# Patient Record
Sex: Male | Born: 1965
Health system: Southern US, Community
[De-identification: ages and names within clinical notes are randomized; demographics above are authoritative.]

## PROBLEM LIST (undated history)

## (undated) DIAGNOSIS — I10 Essential (primary) hypertension: Secondary | ICD-10-CM

## (undated) DIAGNOSIS — E785 Hyperlipidemia, unspecified: Secondary | ICD-10-CM

## (undated) DIAGNOSIS — Z9109 Other allergy status, other than to drugs and biological substances: Secondary | ICD-10-CM

## (undated) DIAGNOSIS — E079 Disorder of thyroid, unspecified: Secondary | ICD-10-CM

## (undated) HISTORY — DX: Hyperlipidemia, unspecified: E78.5

## (undated) HISTORY — PX: POLYPECTOMY: SHX149

## (undated) HISTORY — DX: Essential (primary) hypertension: I10

## (undated) HISTORY — PX: TONSILLECTOMY: SUR1361

## (undated) HISTORY — DX: Other allergy status, other than to drugs and biological substances: Z91.09

## (undated) HISTORY — DX: Disorder of thyroid, unspecified: E07.9

## (undated) HISTORY — PX: NOSE SURGERY: SHX723

---

## 2004-10-14 ENCOUNTER — Ambulatory Visit: Payer: Self-pay | Admitting: Internal Medicine

## 2006-09-08 ENCOUNTER — Encounter: Payer: Self-pay | Admitting: Internal Medicine

## 2006-11-01 DIAGNOSIS — E785 Hyperlipidemia, unspecified: Secondary | ICD-10-CM | POA: Insufficient documentation

## 2006-11-01 DIAGNOSIS — I10 Essential (primary) hypertension: Secondary | ICD-10-CM | POA: Insufficient documentation

## 2006-11-03 ENCOUNTER — Ambulatory Visit: Payer: Self-pay | Admitting: Internal Medicine

## 2010-03-30 ENCOUNTER — Encounter: Payer: Self-pay | Admitting: Cardiovascular Disease

## 2010-05-03 ENCOUNTER — Encounter: Payer: Self-pay | Admitting: Cardiovascular Disease

## 2010-05-10 DIAGNOSIS — R079 Chest pain, unspecified: Secondary | ICD-10-CM | POA: Insufficient documentation

## 2010-05-11 ENCOUNTER — Encounter: Payer: Self-pay | Admitting: Cardiovascular Disease

## 2010-05-11 ENCOUNTER — Ambulatory Visit: Payer: Self-pay | Admitting: Cardiovascular Disease

## 2010-05-19 ENCOUNTER — Telehealth (INDEPENDENT_AMBULATORY_CARE_PROVIDER_SITE_OTHER): Payer: Self-pay

## 2010-05-20 ENCOUNTER — Encounter (HOSPITAL_COMMUNITY)
Admission: RE | Admit: 2010-05-20 | Discharge: 2010-07-13 | Payer: Self-pay | Source: Home / Self Care | Attending: Cardiovascular Disease | Admitting: Cardiovascular Disease

## 2010-05-20 ENCOUNTER — Encounter: Payer: Self-pay | Admitting: Internal Medicine

## 2010-05-20 ENCOUNTER — Encounter: Payer: Self-pay | Admitting: Cardiovascular Disease

## 2010-05-20 ENCOUNTER — Ambulatory Visit: Payer: Self-pay

## 2010-05-20 ENCOUNTER — Ambulatory Visit (HOSPITAL_COMMUNITY)
Admission: RE | Admit: 2010-05-20 | Discharge: 2010-05-20 | Payer: Self-pay | Source: Home / Self Care | Attending: Cardiovascular Disease | Admitting: Cardiovascular Disease

## 2010-06-17 ENCOUNTER — Ambulatory Visit: Admit: 2010-06-17 | Payer: Self-pay | Admitting: Cardiovascular Disease

## 2010-07-13 NOTE — Assessment & Plan Note (Signed)
Summary: NP6/CHEST PAIN ON ELIPTICAL./LG   Visit Type:  new pt visit Primary Provider:  Ravisanker Avva  CC:  chest pain, pressure, and discomfort after exercising....pt c/o lethargy for the last 1 1/2 wks.  History of Present Illness: 45 yo male with history of HTN, hyperlipidemia referred today for evaluation of chest pain. He is an active individual with no prior cardiac disease. He describes  sharp  left sided chest pain with radiation into his left arm about 20 minutes after exercise. No pain with exercise. NO associated SOB, diaphoresis or nausea. First episode was two weeks ago with recurrence one week ago. He has also noticed this several times while on difficult hikes. Following the last episode of chest pain he felt fatigued and has felt that way for the last week. He has a high stress job and describes some dull left arm pressure and chest pressure today.   Current Medications (verified): 1)  Tenormin 25 Mg Tabs (Atenolol) .Marland Kitchen.. 1 Tab Once Daily Brand Name Medically Necessary 2)  Lipitor 20 Mg Tabs (Atorvastatin Calcium) .Marland Kitchen.. 1 Tab Once Daily 3)  Aspirin 81 Mg Tbec (Aspirin) .... Take One Tablet By Mouth Daily 4)  Vitamin B-12 100 Mcg Tabs (Cyanocobalamin) .Marland Kitchen.. 1 Tab Once Daily 5)  Multivitamins   Tabs (Multiple Vitamin) .Marland Kitchen.. 1 Tab Once Daily 6)  Alavert 10 Mg Tabs (Loratadine) .Marland Kitchen.. 1 Tab As Needed  Allergies (verified): 1)  ! Sulfa  Past History:  Past Medical History: HYPERTENSION (ICD-401.9) HYPERLIPIDEMIA (ICD-272.4)  Past Surgical History: Reviewed history from 05/10/2010 and no changes required. Appendectomy Tonsillectomy Nose surgeries 1983, 91, 52  Family History: Father: alive at age 62, BPH, DJD, Hyperlipidemia Mother: alive at age 31. HTN, TIA Siblings: Younger Brother .Marland KitchenHyperlipidemia, HTN  Social History: Married, no kids.  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  Alcohol Use - yes.Marland Kitchenoccasional. No illicit drug use.  Review of Systems       The  patient complains of chest pain.  The patient denies fatigue, malaise, fever, weight gain/loss, vision loss, decreased hearing, hoarseness, palpitations, shortness of breath, prolonged cough, wheezing, sleep apnea, coughing up blood, abdominal pain, blood in stool, nausea, vomiting, diarrhea, heartburn, incontinence, blood in urine, muscle weakness, joint pain, leg swelling, rash, skin lesions, headache, fainting, dizziness, depression, anxiety, enlarged lymph nodes, easy bruising or bleeding, and environmental allergies.    Vital Signs:  Patient profile:   45 year old male Height:      70 inches Weight:      161.50 pounds BMI:     23.26 Pulse rate:   51 / minute Pulse rhythm:   irregular BP sitting:   126 / 90  (left arm) Cuff size:   regular  Vitals Entered By: Danielle Rankin, CMA (May 11, 2010 11:03 AM)  Physical Exam  General:  General: Well developed, well nourished, NAD HEENT: OP clear, mucus membranes moist SKIN: warm, dry Neuro: No focal deficits Musculoskeletal: Muscle strength 5/5 all ext Psychiatric: Mood and affect normal Neck: No JVD, no carotid bruits, no thyromegaly, no lymphadenopathy. Lungs:Clear bilaterally, no wheezes, rhonci, crackles CV: RRR no murmurs, gallops rubs Abdomen: soft, NT, ND, BS present Extremities: No edema, pulses 1+ bilateral DP/PT.     EKG  Procedure date:  05/11/2010  Findings:      Sinus bradycardia. Diffuse ST elevation in precordial leads and inferior leads c/w repolarizatoin abnormality.   Impression & Recommendations:  Problem # 1:  CHEST PAIN (ICD-786.50) He has several risk factors for  CAD including HTN and hyperlipidemia. He has had recent chest and left arm pain after exercise. Will arrange exercise myoview to exclude ischemia and echocardiogram to assess LVEF and structural heart disease.   His updated medication list for this problem includes:    Tenormin 25 Mg Tabs (Atenolol) .Marland Kitchen... 1 tab once daily brand name medically  necessary    Aspirin 81 Mg Tbec (Aspirin) .Marland Kitchen... Take one tablet by mouth daily  Orders: EKG w/ Interpretation (93000) Nuclear Stress Test (Nuc Stress Test) Echocardiogram (Echo)  Patient Instructions: 1)  Your physician recommends that you schedule a follow-up appointment in: 3-4 weeks 2)  Your physician recommends that you continue on your current medications as directed. Please refer to the Current Medication list given to you today. 3)  Your physician has requested that you have an echocardiogram.  Echocardiography is a painless test that uses sound waves to create images of your heart. It provides your doctor with information about the size and shape of your heart and how well your heart's chambers and valves are working.  This procedure takes approximately one hour. There are no restrictions for this procedure. 4)  Your physician has requested that you have an exercise stress myoview.  For further information please visit [email or web address]  Please follow instruction sheet, as given.

## 2010-07-13 NOTE — Progress Notes (Signed)
Summary: Nuc. Pre-Procedure  Phone Note Outgoing Call Call back at [facility] Phone [phone]   Call placed by: Irean Hong, RN,  May 19, 2010 2:26 PM Summary of Call: Left message with information on Myoview Information Sheet (see scanned document for details).      Nuclear Med Background Indications for Stress Test: Evaluation for Ischemia     Symptoms: Chest Pain with Exertion, Chest Pressure with Exertion, Fatigue, Fatigue with Exertion  Symptoms Comments: Radiates to left arm.   Nuclear Pre-Procedure Cardiac Risk Factors: Hypertension, Lipids Height (in): 70

## 2010-07-13 NOTE — Progress Notes (Signed)
Summary: [facility]  [facility]   Imported By: Earl Many 05/14/2010 16:31:42  _____________________________________________________________________  External Attachment:    Type:   Image     Comment:   External Document

## 2010-07-15 NOTE — Assessment & Plan Note (Signed)
Summary: Cardiology Nuclear Testing  Nuclear Med Background Indications for Stress Test: Evaluation for Ischemia    History Comments: No documented CAD  Symptoms: Chest Pain, Chest Pain with Exertion, Chest Pressure, Chest Pressure with Exertion, Fatigue, Fatigue with Exertion  Symptoms Comments:  CP>(L) arm.   Nuclear Pre-Procedure Cardiac Risk Factors: Hypertension, Lipids Caffeine/Decaff Intake: None NPO After: 8:00 PM Lungs: Clear IV 0.9% NS with Angio Cath: 22g     IV Site: R Antecubital IV Started by: Irean Hong, RN Chest Size (in) 40     Height (in): 70 Weight (lb): 154 BMI: 22.18 Tech Comments: Tenormin held x 36 hours.  Nuclear Med Study 1 or 2 day study:  1 day     Stress Test Type:  Stress Reading MD:  Arvilla Meres, MD     Referring MD:  Verne Carrow, MD Resting Radionuclide:  Technetium 48m Tetrofosmin     Resting Radionuclide Dose:  10.7 mCi  Stress Radionuclide:  Technetium 109m Tetrofosmin     Stress Radionuclide Dose:  33 mCi   Stress Protocol Exercise Time (min):  14:00 min     Max HR:  157 bpm     Predicted Max HR:  174 bpm  Max Systolic BP: 169 mm Hg     Percent Max HR:  90.23 %     METS: 17.2 Rate Pressure Product:  40347    Stress Test Technologist:  [employer], CMA-N     Nuclear Technologist:  Domenic Polite, CNMT  Rest Procedure  Myocardial perfusion imaging was performed at rest 45 minutes following the intravenous administration of Technetium 70m Tetrofosmin.  Stress Procedure  The patient exercised for fourteen minutes.  The patient stopped due to fatigue and denied any chest pain.  There were nonspecific ST-T wave changes, more pronounced in recovery.  There were occasional PVC's and PAC's.  Technetium 81m Tetrofosmin was injected at peak exercise and myocardial perfusion imaging was performed after a brief delay.  QPS Raw Data Images:  Normal; no motion artifact; normal heart/lung ratio. Stress Images:  Normal homogeneous  uptake in all areas of the myocardium. Rest Images:  Normal homogeneous uptake in all areas of the myocardium. Subtraction (SDS):  Normal Transient Ischemic Dilatation:  .98  (Normal <1.22)  Lung/Heart Ratio:  .36  (Normal <0.45)  Quantitative Gated Spect Images QGS EDV:  90 ml QGS ESV:  39 ml QGS EF:  57 % QGS cine images:  Normal  Findings Normal nuclear study      Overall Impression  Exercise Capacity: Excellent exercise capacity. BP Response: Normal blood pressure response. Clinical Symptoms: There is dyspnea. ECG Impression: No significant ST segment change suggestive of ischemia. Overall Impression: Normal stress nuclear study.  Appended Document: Cardiology Nuclear Testing Normal stress. Can we call him? thanks, cdm

## 2011-10-27 ENCOUNTER — Other Ambulatory Visit: Payer: Self-pay | Admitting: Internal Medicine

## 2011-10-27 ENCOUNTER — Ambulatory Visit
Admission: RE | Admit: 2011-10-27 | Discharge: 2011-10-27 | Disposition: A | Discharge status: Home / Self Care | Payer: BC Managed Care – PPO | Source: Ambulatory Visit | Attending: Internal Medicine | Admitting: Internal Medicine

## 2011-10-27 DIAGNOSIS — E049 Nontoxic goiter, unspecified: Secondary | ICD-10-CM

## 2011-11-02 ENCOUNTER — Other Ambulatory Visit: Payer: Self-pay | Admitting: Internal Medicine

## 2011-11-02 DIAGNOSIS — E041 Nontoxic single thyroid nodule: Secondary | ICD-10-CM

## 2011-11-08 ENCOUNTER — Ambulatory Visit
Admission: RE | Admit: 2011-11-08 | Discharge: 2011-11-08 | Disposition: A | Discharge status: Home / Self Care | Payer: BC Managed Care – PPO | Source: Ambulatory Visit | Attending: Internal Medicine | Admitting: Internal Medicine

## 2011-11-08 ENCOUNTER — Other Ambulatory Visit (HOSPITAL_COMMUNITY)
Admission: RE | Admit: 2011-11-08 | Discharge: 2011-11-08 | Disposition: A | Discharge status: Home / Self Care | Payer: BC Managed Care – PPO | Source: Ambulatory Visit | Attending: Diagnostic Radiology | Admitting: Diagnostic Radiology

## 2011-11-08 DIAGNOSIS — E041 Nontoxic single thyroid nodule: Secondary | ICD-10-CM | POA: Insufficient documentation

## 2012-05-29 ENCOUNTER — Other Ambulatory Visit: Payer: Self-pay | Admitting: Internal Medicine

## 2012-05-29 DIAGNOSIS — E041 Nontoxic single thyroid nodule: Secondary | ICD-10-CM

## 2012-05-31 ENCOUNTER — Ambulatory Visit
Admission: RE | Admit: 2012-05-31 | Discharge: 2012-05-31 | Disposition: A | Discharge status: Home / Self Care | Payer: BC Managed Care – PPO | Source: Ambulatory Visit | Attending: Internal Medicine | Admitting: Internal Medicine

## 2012-05-31 DIAGNOSIS — E041 Nontoxic single thyroid nodule: Secondary | ICD-10-CM

## 2012-11-01 ENCOUNTER — Other Ambulatory Visit: Payer: Self-pay | Admitting: Internal Medicine

## 2012-11-01 DIAGNOSIS — E049 Nontoxic goiter, unspecified: Secondary | ICD-10-CM

## 2013-04-16 ENCOUNTER — Ambulatory Visit
Admission: RE | Admit: 2013-04-16 | Discharge: 2013-04-16 | Disposition: A | Discharge status: Home / Self Care | Payer: BC Managed Care – PPO | Source: Ambulatory Visit | Attending: Internal Medicine | Admitting: Internal Medicine

## 2013-04-16 DIAGNOSIS — E049 Nontoxic goiter, unspecified: Secondary | ICD-10-CM

## 2013-04-19 ENCOUNTER — Other Ambulatory Visit: Payer: BC Managed Care – PPO

## 2013-11-08 ENCOUNTER — Encounter: Payer: Self-pay | Admitting: Internal Medicine

## 2013-12-06 ENCOUNTER — Ambulatory Visit (AMBULATORY_SURGERY_CENTER): Payer: Self-pay

## 2013-12-06 ENCOUNTER — Encounter: Payer: Self-pay | Admitting: Internal Medicine

## 2013-12-06 VITALS — Ht 69.0 in | Wt 154.0 lb

## 2013-12-06 DIAGNOSIS — Z1211 Encounter for screening for malignant neoplasm of colon: Secondary | ICD-10-CM

## 2013-12-06 MED ORDER — MOVIPREP 100 G PO SOLR: 1.0000 | Freq: Once | ORAL | Status: DC

## 2013-12-06 NOTE — Progress Notes (Signed)
No allergies to eggs or soy No home oxygen No diet/weight loss meds No past problems with anesthesia  Has email  Emmi instructions given for colonoscopy 

## 2013-12-19 ENCOUNTER — Encounter: Payer: BC Managed Care – PPO | Admitting: Internal Medicine

## 2013-12-24 IMAGING — US US THYROID BIOPSY
1 series · 10 of 10 positions shown · non-contrast
Comparison: 10/27/2011

CLINICAL DATA: Dominant right thyroid nodule.

ULTRASOUND GUIDED NEEDLE ASPIRATE BIOPSY OF THE THYROID GLAND

[Series 1: us thyroid biopsy · 0.07mm/px · 10 acquisitions, 10 frames shown]
[im 1/10]
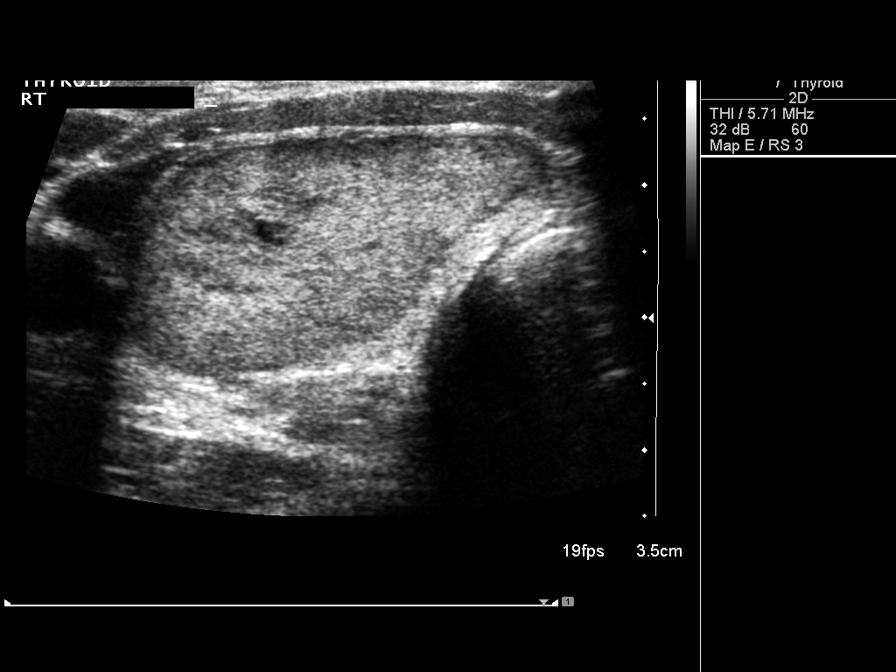
[im 2/10]
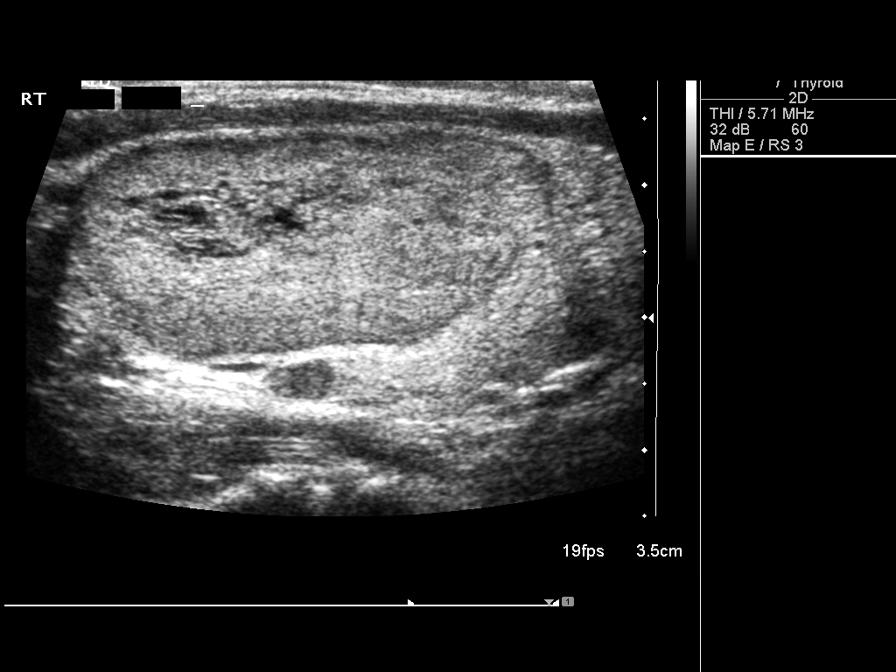
[im 3/10]
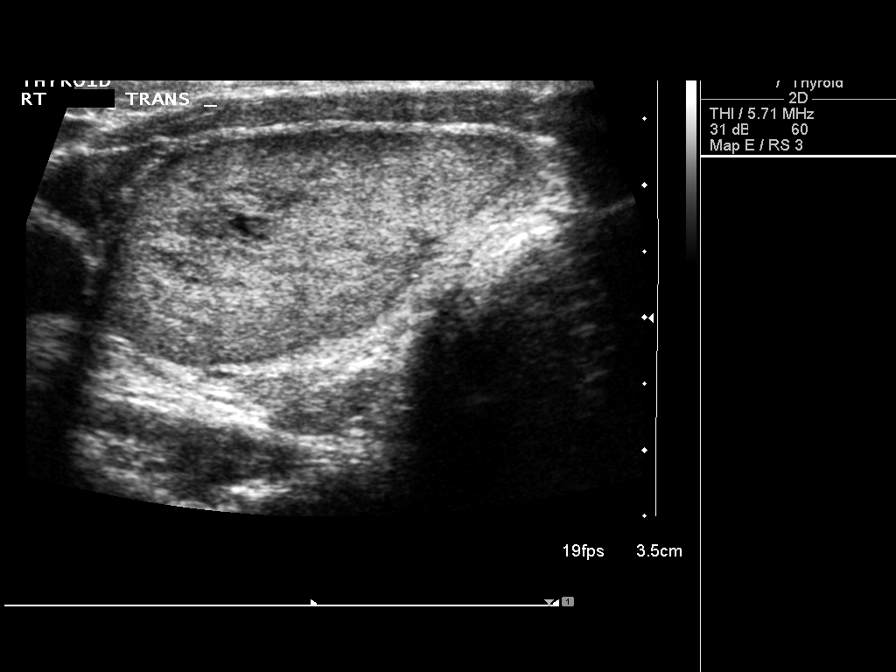
[im 4/10]
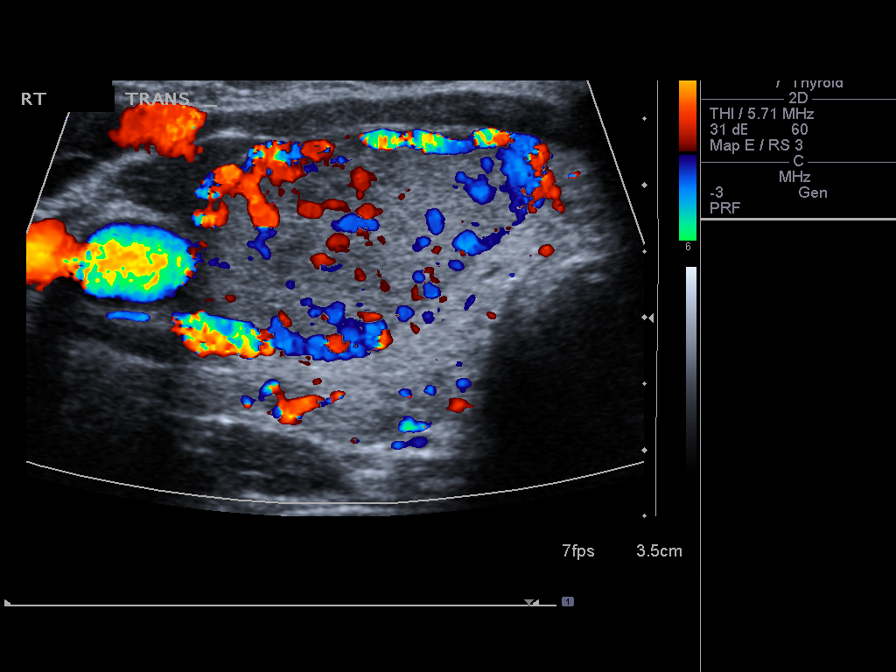
[im 5/10]
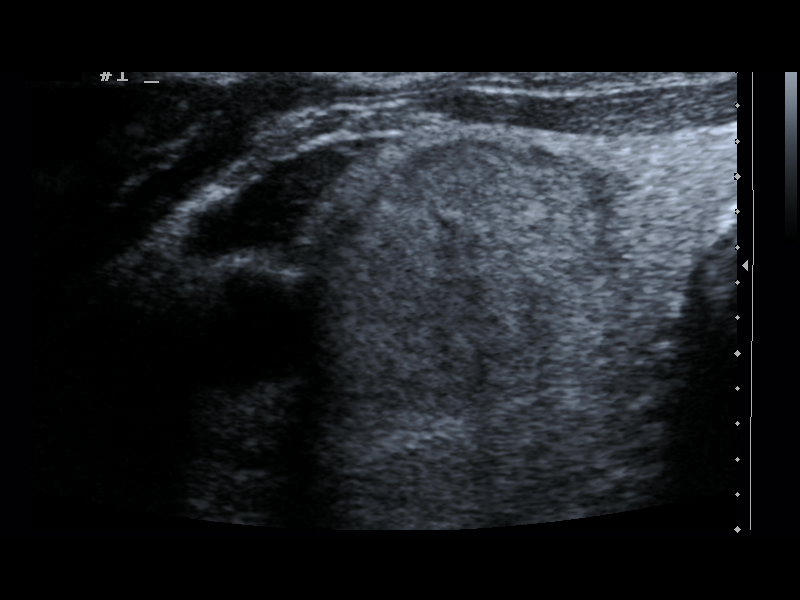
[im 6/10]
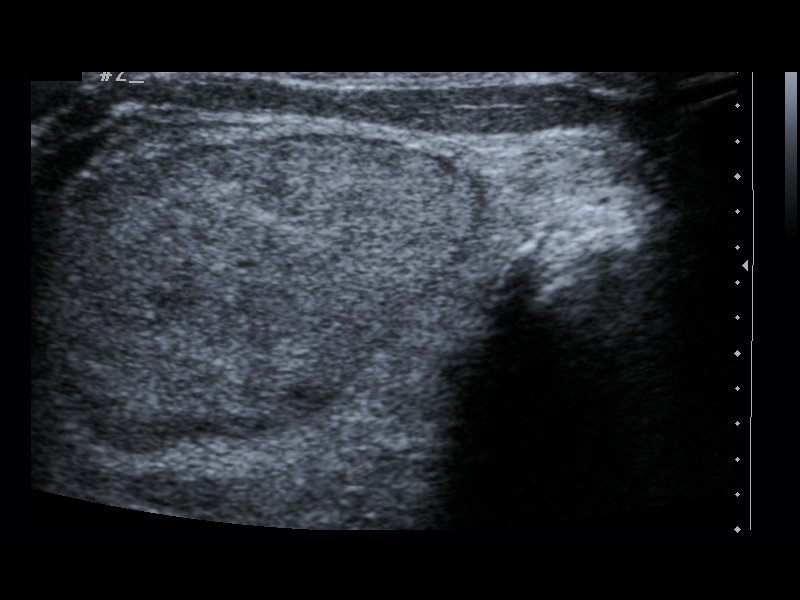
[im 7/10]
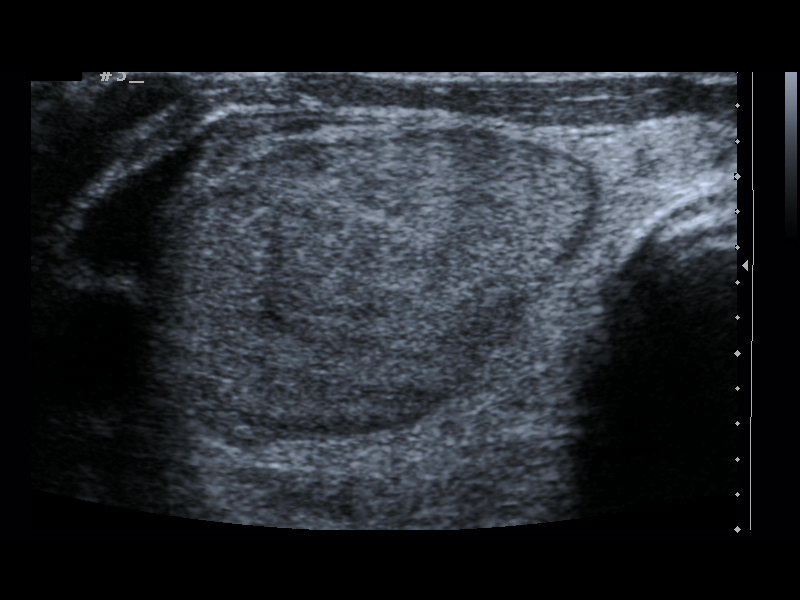
[im 8/10]
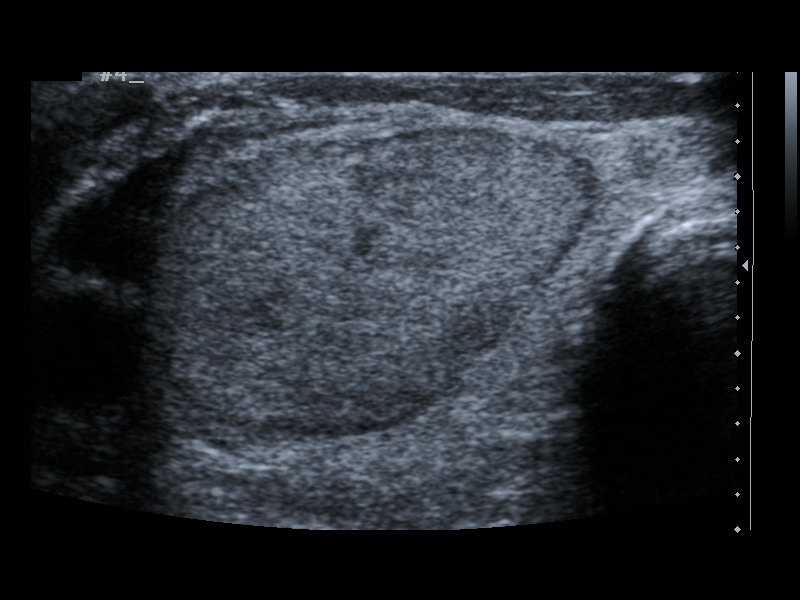
[im 9/10]
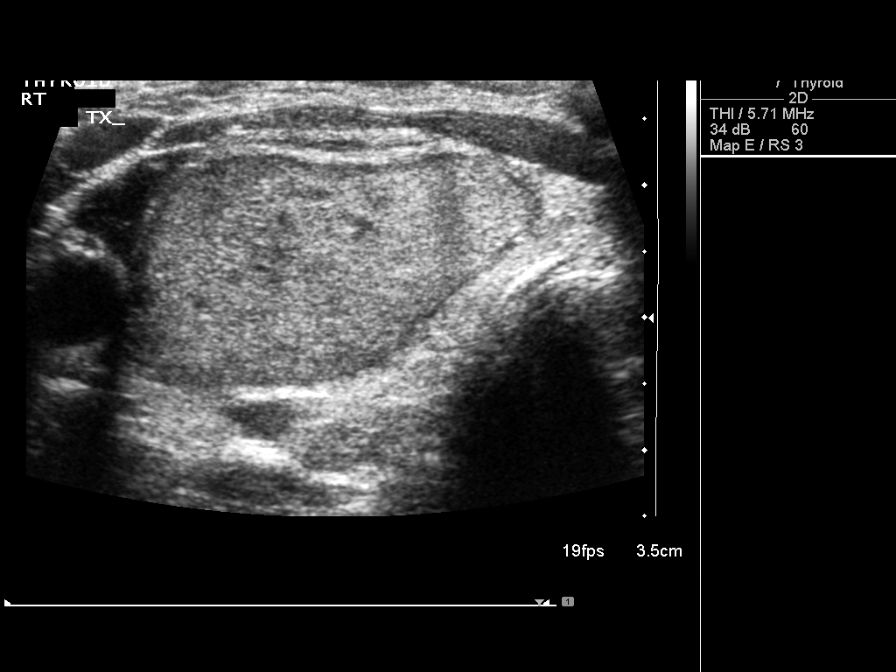
[im 10/10]
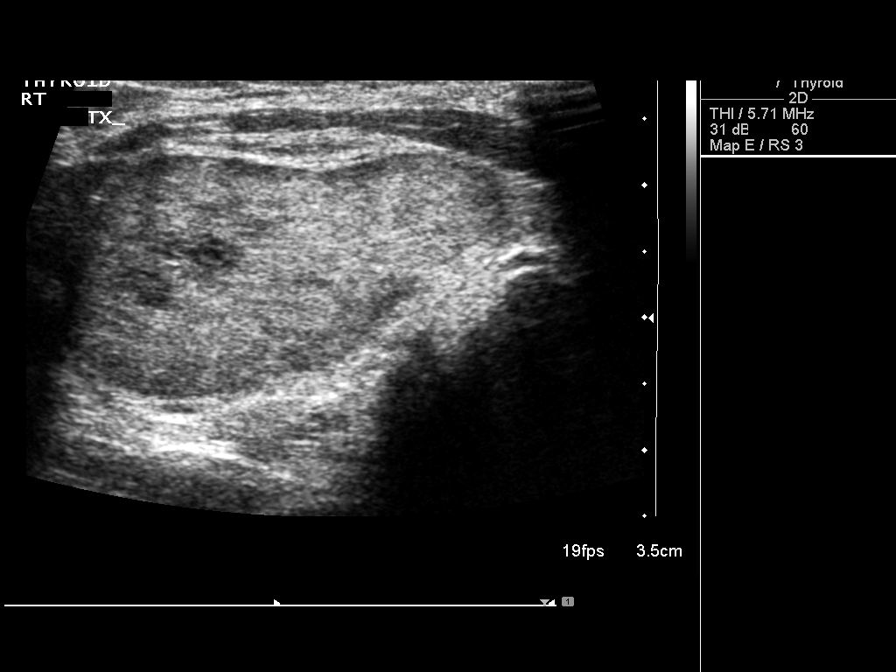

[10 of 10 positions shown; findings below may reference images not displayed]

Thyroid biopsy was thoroughly discussed with the patient and
questions were answered.  The benefits, risks, alternatives, and
complications were also discussed.  The patient understands and
wishes to proceed with the procedure.  Written consent was
obtained.

Ultrasound was performed to localize and mark an adequate site for
the biopsy.  The patient was then prepped and draped in a normal
sterile fashion.  Local anesthesia was provided with 1% lidocaine.
Using direct ultrasound guidance, 4 passes were made using 25 gauge
needles into the nodule within the right lobe of the thyroid.
Ultrasound was used to confirm needle placements on all occasions.
Specimens were sent to Pathology for analysis.

Complications:  None
FINDINGS: Dominant right thyroid nodule with mild heterogeneity.
IMPRESSION: Ultrasound guided needle aspirate biopsy performed of the right
thyroid nodule.

## 2013-12-31 ENCOUNTER — Encounter: Payer: Self-pay | Admitting: Internal Medicine

## 2013-12-31 ENCOUNTER — Ambulatory Visit (AMBULATORY_SURGERY_CENTER): Payer: BC Managed Care – PPO | Admitting: Internal Medicine

## 2013-12-31 VITALS — BP 117/76 | HR 50 | Temp 96.3°F | Resp 16 | Ht 69.0 in | Wt 154.0 lb

## 2013-12-31 DIAGNOSIS — Z1211 Encounter for screening for malignant neoplasm of colon: Secondary | ICD-10-CM

## 2013-12-31 DIAGNOSIS — D126 Benign neoplasm of colon, unspecified: Secondary | ICD-10-CM

## 2013-12-31 HISTORY — PX: COLONOSCOPY: SHX174

## 2013-12-31 MED ORDER — SODIUM CHLORIDE 0.9 % IV SOLN: 500.0000 mL | INTRAVENOUS | Status: DC

## 2013-12-31 NOTE — Patient Instructions (Signed)
YOU HAD AN ENDOSCOPIC PROCEDURE TODAY AT THE [facility]: Refer to the procedure report that was given to you for any specific questions about what was found during the examination.  If the procedure report does not answer your questions, please call your gastroenterologist to clarify.  If you requested that your care partner not be given the details of your procedure findings, then the procedure report has been included in a sealed envelope for you to review at your convenience later.  YOU SHOULD EXPECT: Some feelings of bloating in the abdomen. Passage of more gas than usual.  Walking can help get rid of the air that was put into your GI tract during the procedure and reduce the bloating. If you had a lower endoscopy (such as a colonoscopy or flexible sigmoidoscopy) you may notice spotting of blood in your stool or on the toilet paper. If you underwent a bowel prep for your procedure, then you may not have a normal bowel movement for a few days.  DIET: Your first meal following the procedure should be a light meal and then it is ok to progress to your normal diet.  A half-sandwich or bowl of soup is an example of a good first meal.  Heavy or fried foods are harder to digest and may make you feel nauseous or bloated.  Likewise meals heavy in dairy and vegetables can cause extra gas to form and this can also increase the bloating.  Drink plenty of fluids but you should avoid alcoholic beverages for 24 hours.  ACTIVITY: Your care partner should take you home directly after the procedure.  You should plan to take it easy, moving slowly for the rest of the day.  You can resume normal activity the day after the procedure however you should NOT DRIVE or use heavy machinery for 24 hours (because of the sedation medicines used during the test).    SYMPTOMS TO REPORT IMMEDIATELY: A gastroenterologist can be reached at any hour.  During normal business hours, 8:30 AM to 5:00 PM Monday through Friday,  call [phone].  After hours and on weekends, please call the GI answering service at [phone] who will take a message and have the physician on call contact you.   Following lower endoscopy (colonoscopy or flexible sigmoidoscopy):  Excessive amounts of blood in the stool  Significant tenderness or worsening of abdominal pains  Swelling of the abdomen that is new, acute  Fever of 100F or higher  FOLLOW UP: If any biopsies were taken you will be contacted by phone or by letter within the next 1-3 weeks.  Call your gastroenterologist if you have not heard about the biopsies in 3 weeks.  Our staff will call the home number listed on your records the next business day following your procedure to check on you and address any questions or concerns that you may have at that time regarding the information given to you following your procedure. This is a courtesy call and so if there is no answer at the home number and we have not heard from you through the emergency physician on call, we will assume that you have returned to your regular daily activities without incident.  SIGNATURES/CONFIDENTIALITY: You and/or your care partner have signed paperwork which will be entered into your electronic medical record.  These signatures attest to the fact that that the information above on your After Visit Summary has been reviewed and is understood.  Full responsibility of the confidentiality of this   discharge information lies with you and/or your care-partner.     Handouts were given to your care partner on polyps, diverticulosis, and a high fiber diet with liberal fluid intake. You may resume your current medications today. Await biopsy results. Please call if any questions or concerns.   

## 2013-12-31 NOTE — Progress Notes (Signed)
No complaints noted in the recovery room. Maw   

## 2013-12-31 NOTE — Op Note (Signed)
[place]  [address]. [place], [id number]   COLONOSCOPY PROCEDURE REPORT  PATIENT: Mark Sims, Mark Sims  MR#: [id number] BIRTHDATE: 1966-01-22 , 48  yrs. old GENDER: Male ENDOSCOPIST: Jerene Bears, MD REFERRED WU:[id number] Avva, M.D. PROCEDURE DATE:  12/31/2013 PROCEDURE:   Colonoscopy with snare polypectomy First Screening Colonoscopy - Avg.  risk and is 50 yrs.  old or older Yes.  Prior Negative Screening - Now for repeat screening. N/A  History of Adenoma - Now for follow-up colonoscopy & has been > or = to 3 yrs.  N/A  Polyps Removed Today? Yes. ASA CLASS:   Class II INDICATIONS:average risk screening and first colonoscopy. MEDICATIONS: MAC sedation, administered by CRNA, Propofol (Diprivan), and propofol (Diprivan) 250mg  IV  DESCRIPTION OF PROCEDURE:   After the risks benefits and alternatives of the procedure were thoroughly explained, informed consent was obtained.  A digital rectal exam revealed external hemorrhoids.   The LB [id number]  endoscope was introduced through the anus and advanced to the terminal ileum which was intubated for a short distance. No adverse events experienced. The quality of the prep was good, using MoviPrep  The instrument was then slowly withdrawn as the colon was fully examined.  COLON FINDINGS: The mucosa appeared normal in the terminal ileum. Two sessile polyps ranging between 3-82mm in size, one with mucous cap, were found in the transverse colon.  Polypectomy was performed using cold snare.  All resections were complete and all polyp tissue was completely retrieved.   There was mild diverticulosis noted in the sigmoid colon with associated muscular hypertrophy. Retroflexed views revealed small external hemorrhoids. The time to cecum=2 minutes 10 seconds.  Withdrawal time=10 minutes 51 seconds. The scope was withdrawn and the procedure completed. COMPLICATIONS: There were no complications.  ENDOSCOPIC IMPRESSION: 1.    Normal mucosa in the terminal ileum 2.   Two sessile polyps ranging between 3-10mm in size were found in the transverse colon; Polypectomy was performed using cold snare 3.   There was mild diverticulosis noted in the sigmoid colon  RECOMMENDATIONS: 1.  Await pathology results 2.  High fiber diet 3.  If the polyps removed today are proven to be adenomatous (pre-cancerous) polyps, you will need a repeat colonoscopy in 5 years.  Otherwise you should continue to follow colorectal cancer screening guidelines for "routine risk" patients with colonoscopy in 10 years.  You will receive a letter within 1-2 weeks with the results of your biopsy as well as final recommendations.  Please call my office if you have not received a letter after 3 weeks.   eSigned:  Jerene Bears, MD 12/31/2013 8:29 AM      cc: The Patient and Prince Solian, MD

## 2013-12-31 NOTE — Progress Notes (Signed)
Called to room to assist during endoscopic procedure.  Patient ID and intended procedure confirmed with present staff. Received instructions for my participation in the procedure from the performing physician.  

## 2013-12-31 NOTE — Progress Notes (Signed)
A/ox3 pleased with MAC, report to Annette RN 

## 2014-01-01 ENCOUNTER — Telehealth: Payer: Self-pay | Admitting: *Deleted

## 2014-01-01 NOTE — Telephone Encounter (Signed)
No answer, left message to call if questions or concerns. 

## 2014-01-13 ENCOUNTER — Encounter: Payer: Self-pay | Admitting: Internal Medicine

## 2014-04-01 ENCOUNTER — Emergency Department (HOSPITAL_COMMUNITY)
Admission: EM | Admit: 2014-04-01 | Discharge: 2014-04-01 | Disposition: A | Discharge status: Home / Self Care | Payer: BC Managed Care – PPO | Attending: Emergency Medicine | Admitting: Emergency Medicine

## 2014-04-01 ENCOUNTER — Encounter (HOSPITAL_COMMUNITY): Payer: Self-pay | Admitting: Emergency Medicine

## 2014-04-01 DIAGNOSIS — Z79899 Other long term (current) drug therapy: Secondary | ICD-10-CM | POA: Diagnosis not present

## 2014-04-01 DIAGNOSIS — I10 Essential (primary) hypertension: Secondary | ICD-10-CM | POA: Diagnosis not present

## 2014-04-01 DIAGNOSIS — R040 Epistaxis: Secondary | ICD-10-CM | POA: Diagnosis not present

## 2014-04-01 DIAGNOSIS — Z7982 Long term (current) use of aspirin: Secondary | ICD-10-CM | POA: Diagnosis not present

## 2014-04-01 DIAGNOSIS — Z9889 Other specified postprocedural states: Secondary | ICD-10-CM | POA: Diagnosis not present

## 2014-04-01 DIAGNOSIS — E785 Hyperlipidemia, unspecified: Secondary | ICD-10-CM | POA: Insufficient documentation

## 2014-04-01 MED ORDER — OXYMETAZOLINE HCL 0.05 % NA SOLN
1.0000 | Freq: Once | NASAL | Status: AC
  Administered 2014-04-01: 1 via NASAL
  Filled 2014-04-01: qty 15

## 2014-04-01 MED ORDER — PHENYLEPHRINE HCL 0.5 % NA SOLN
1.0000 [drp] | Freq: Once | NASAL | Status: DC
  Filled 2014-04-01: qty 15

## 2014-04-01 NOTE — ED Provider Notes (Signed)
CSN: [id number]     Arrival date & time 04/01/14  0347 History   First MD Initiated Contact with Patient 04/01/14 0801     Chief Complaint  Patient presents with  . Epistaxis     (Consider location/radiation/quality/duration/timing/severity/associated sxs/prior Treatment) HPI Comments: Patient is a 48 year old male past medical history significant for hypertension, hyperlipidemia, environmental allergies presenting to the emergency department for acute onset right-sided nose bleed that began approximately 15 minutes prior to arrival. Patient states he woke up, with his nose that started the nosebleed. He states one large clot came out of his nose. Bleeding has slowed with constant direct pressure. History of trauma to his nose. He is not on any blood thinners. Previous nasal surgeries for his septal deviation, 24 years ago.   Past Medical History  Diagnosis Date  . Hypertension   . Hyperlipemia   . Environmental allergies    Past Surgical History  Procedure Laterality Date  . Nose surgery      x3; last one 1989  . Tonsillectomy      age 56   Family History  Problem Relation Age of Onset  . Colon cancer Neg Hx   . Pancreatic cancer Neg Hx   . Rectal cancer Neg Hx   . Stomach cancer Neg Hx   . Heart disease Mother   . Transient ischemic attack Mother   . Kidney cancer Father   . Heart disease Father   . Heart disease Maternal Grandmother   . Heart disease Maternal Grandfather   . Heart disease Paternal Grandmother   . Heart disease Paternal Grandfather    History  Substance Use Topics  . Smoking status: Never Smoker   . Smokeless tobacco: Never Used  . Alcohol Use: 0.6 oz/week    1 Glasses of wine per week     Comment: and liquor    Review of Systems  HENT: Positive for congestion and nosebleeds.   All other systems reviewed and are negative.     Allergies  Sulfonamide derivatives  Home Medications   Prior to Admission medications   Medication Sig Start  Date End Date Taking? Authorizing Provider  aspirin 81 MG EC tablet Take 81 mg by mouth daily. Swallow whole.   Yes Historical Provider, MD  atenolol (TENORMIN) 25 MG tablet Take by mouth daily.   Yes Historical Provider, MD  atorvastatin (LIPITOR) 20 MG tablet Take 20 mg by mouth daily.   Yes Historical Provider, MD  B Complex-C (B-COMPLEX WITH VITAMIN C) tablet Take 1 tablet by mouth daily.   Yes Historical Provider, MD  levothyroxine (SYNTHROID, LEVOTHROID) 25 MCG tablet Take 25 mcg by mouth daily before breakfast.   Yes Historical Provider, MD  montelukast (SINGULAIR) 10 MG tablet Take 10 mg by mouth at bedtime.   Yes Historical Provider, MD  Multiple Vitamin (MULTIVITAMIN) tablet Take 1 tablet by mouth daily.   Yes Historical Provider, MD   BP 125/78  Pulse 45  Temp(Src) 97.4 F (36.3 C) (Oral)  Resp 16  Ht 5\' 9"  (1.753 m)  Wt 150 lb (68.04 kg)  BMI 22.14 kg/m2  SpO2 100% Physical Exam  Nursing note and vitals reviewed. Constitutional: He is oriented to person, place, and time. He appears well-developed and well-nourished. No distress.  HENT:  Head: Normocephalic and atraumatic.  Right Ear: External ear normal.  Left Ear: External ear normal.  Nose: No rhinorrhea, septal deviation or nasal septal hematoma.  No foreign bodies. Right sinus exhibits no maxillary sinus tenderness  and no frontal sinus tenderness. Left sinus exhibits no maxillary sinus tenderness and no frontal sinus tenderness.  Mouth/Throat: Oropharynx is clear and moist.  Dried blood in bilateral nares. Occasional drip of blood. No active nosebleed. No active posterior oropharynx bleeding. Small streak of dried blood in posterior pharynx.  Eyes: Conjunctivae are normal.  Neck: Normal range of motion. Neck supple.  Cardiovascular: Normal rate, regular rhythm and normal heart sounds.   Pulmonary/Chest: Effort normal and breath sounds normal.  Abdominal: Soft.  Musculoskeletal: Normal range of motion.  Neurological:  He is alert and oriented to person, place, and time.  Skin: Skin is warm and dry. He is not diaphoretic.  Psychiatric: He has a normal mood and affect.    ED Course  Procedures (including critical care time) Medications  oxymetazoline (AFRIN) 0.05 % nasal spray 1 spray (1 spray Each Nare Given 04/01/14 0858)    Labs Review Labs Reviewed - No data to display  Imaging Review No results found.   EKG Interpretation None      MDM   Final diagnoses:  Epistaxis    Filed Vitals:   04/01/14 1013  BP: 125/78  Pulse: 45  Temp:   Resp: 16   Afebrile, NAD, non-toxic appearing, AAOx4.   Patient presents to the emergency department after a nosebleed approximately 15 minutes prior to arrival. Patient is not actively bleeding in the emergency department at this time. No septal hematoma or deviation noted. Patient is not on any blood thinners. Likely anterior nosebleed. He is hemodynamically stable at discharge. His TPN [facility] for followup. Precautions discussed. Patient is agreeable to plan. Patient stable upon discharge. Patient d/w with Dr. Kathrynn Humble, agrees with plan.      Harlow Mares, PA-C 04/01/14 1506

## 2014-04-01 NOTE — Discharge Instructions (Signed)
Please follow up with your primary care physician in 1-2 days. If you do not have one please call the [facility] number listed above. Please follow up with Dr. Constance Holster to schedule a follow up appointment for any further ENT needs. Please use the Afrin spray as advised. Please try to keep your bedroom with moist air via a humidifier. You may also place vasoline in your nostrils at night time. Please continue to use your daily allergy medication. Please read all discharge instructions and return precautions.    Nosebleed Nosebleeds can be caused by many conditions, including trauma, infections, polyps, foreign bodies, dry mucous membranes or climate, medicines, and air conditioning. Most nosebleeds occur in the front of the nose. Because of this location, most nosebleeds can be controlled by pinching the nostrils gently and continuously for at least 10 to 20 minutes. The long, continuous pressure allows enough time for the blood to clot. If pressure is released during that 10 to 20 minute time period, the process may have to be started again. The nosebleed may stop by itself or quit with pressure, or it may need concentrated heating (cautery) or pressure from packing. HOME CARE INSTRUCTIONS   If your nose was packed, try to maintain the pack inside until your health care provider removes it. If a gauze pack was used and it starts to fall out, gently replace it or cut the end off. Do not cut if a balloon catheter was used to pack the nose. Otherwise, do not remove unless instructed.  Avoid blowing your nose for 12 hours after treatment. This could dislodge the pack or clot and start the bleeding again.  If the bleeding starts again, sit up and bend forward, gently pinching the front half of your nose continuously for 20 minutes.  If bleeding was caused by dry mucous membranes, use over-the-counter saline nasal spray or gel. This will keep the mucous membranes moist and allow them to  heal. If you must use a lubricant, choose the water-soluble variety. Use it only sparingly and not within several hours of lying down.  Do not use petroleum jelly or mineral oil, as these may drip into the lungs and cause serious problems.  Maintain humidity in your home by using less air conditioning or by using a humidifier.  Do not use aspirin or medicines which make bleeding more likely. Your health care provider can give you recommendations on this.  Resume normal activities as you are able, but try to avoid straining, lifting, or bending at the waist for several days.  If the nosebleeds become recurrent and the cause is unknown, your health care provider may suggest laboratory tests. SEEK MEDICAL CARE IF: You have a fever. SEEK IMMEDIATE MEDICAL CARE IF:   Bleeding recurs and cannot be controlled.  There is unusual bleeding from or bruising on other parts of the body.  Nosebleeds continue.  There is any worsening of the condition which originally brought you in.  You become light-headed, feel faint, become sweaty, or vomit blood. MAKE SURE YOU:   Understand these instructions.  Will watch your condition.  Will get help right away if you are not doing well or get worse. Document Released: 03/09/2005 Document Revised: 10/14/2013 Document Reviewed: 04/30/2009 [facility] Patient Information 2015 [place], [place]. This information is not intended to replace advice given to you by your health care provider. Make sure you discuss any questions you have with your health care provider.

## 2014-04-01 NOTE — ED Notes (Signed)
Per pt, states his nose has been bleeding for approximately 15 minutes. He woke up and blew his nose and it began bleeding. Pt also states a clot came out when he blew his nose.

## 2014-04-01 NOTE — ED Notes (Addendum)
Pt comfortable, nose has stopped bleeding at this point. Pt states he does feel stopped up still in the left nostril.

## 2014-04-01 NOTE — ED Notes (Signed)
PA at bedside.

## 2014-04-01 NOTE — ED Provider Notes (Signed)
Medical screening examination/treatment/procedure(s) were performed by non-physician practitioner and as supervising physician I was immediately available for consultation/collaboration.   EKG Interpretation None       Varney Biles, MD 04/01/14 1811

## 2014-12-05 ENCOUNTER — Ambulatory Visit (INDEPENDENT_AMBULATORY_CARE_PROVIDER_SITE_OTHER): Payer: BLUE CROSS/BLUE SHIELD | Admitting: Podiatry

## 2014-12-05 ENCOUNTER — Encounter: Payer: Self-pay | Admitting: Podiatry

## 2014-12-05 DIAGNOSIS — M79673 Pain in unspecified foot: Secondary | ICD-10-CM | POA: Diagnosis not present

## 2014-12-05 DIAGNOSIS — M722 Plantar fascial fibromatosis: Secondary | ICD-10-CM | POA: Diagnosis not present

## 2014-12-05 NOTE — Progress Notes (Signed)
   Subjective:    Patient ID: Mark Sims, male    DOB: Mar 20, 1966, 49 y.o.   MRN: [id number]  HPI PT STATED HAVE HISTORY OF PLANTAR FASCIITIS ON B/L FEET AND NEEDED A NEW PAIR ORTHOTICS.   Review of Systems  All other systems reviewed and are negative.      Objective:   Physical Exam        Assessment & Plan:

## 2014-12-08 NOTE — Progress Notes (Signed)
Subjective:     Patient ID: Mark Sims, male   DOB: 04/15/1966, 49 y.o.   MRN: [id number]  HPI patient states that he's had heel pain in his orthotics that been helping him but recently the pain is returning and he is concerned orthotics have lost some of their ability to hold his arch up   Review of Systems     Objective:   Physical Exam Neurovascular status intact muscle strength adequate with discomfort still noted plantar arch bilateral but moderate intensity with moderate flatfoot deformity    Assessment:     Plantar fasciitis bilateral with flatfoot deformity also noted    Plan:     Reviewed condition and recommended new orthotics and scanned for customized orthotic devices at the current time of a Berkley type

## 2014-12-26 ENCOUNTER — Ambulatory Visit: Payer: BLUE CROSS/BLUE SHIELD | Admitting: *Deleted

## 2014-12-26 DIAGNOSIS — M722 Plantar fascial fibromatosis: Secondary | ICD-10-CM

## 2014-12-26 NOTE — Progress Notes (Signed)
Patient ID: Mark Sims, male   DOB: [birth year], 49 y.o.   MRN: [id number] Patient presents for orthotic pick up.  Verbal and written break in and wear instructions given.  Patient will follow up in 4 weeks if symptoms worsen or fail to improve.

## 2014-12-26 NOTE — Patient Instructions (Signed)

## 2015-01-07 ENCOUNTER — Telehealth: Payer: Self-pay | Admitting: *Deleted

## 2015-01-07 NOTE — Telephone Encounter (Signed)
Patient called office stating that his orthotics were causing pain.  After discussing with Mr Matsuo he states that it feels like he has a golf bal in his arch.  I told patient that is not normal and that the solution is to send them back for correction when he can spare them.  He stated that he will bring them by this afternoon, we eill send them back and have the arch lowered and smoothed and call him as soon as they return.

## 2015-01-30 ENCOUNTER — Ambulatory Visit: Payer: BLUE CROSS/BLUE SHIELD | Admitting: *Deleted

## 2015-01-30 DIAGNOSIS — M722 Plantar fascial fibromatosis: Secondary | ICD-10-CM

## 2015-01-30 NOTE — Progress Notes (Signed)
Patient ID: Mark Sims, male   DOB: Oct 15, 1965, 49 y.o.   MRN: [id number] Patient presents for fitting of adjusted orthotics.  Orthotics feel good patient will call if any future needs.

## 2015-03-02 ENCOUNTER — Encounter: Payer: Self-pay | Admitting: *Deleted

## 2015-03-02 NOTE — Progress Notes (Signed)
Patient ID: Mark Sims, male   DOB: Aug 10, 1965, 49 y.o.   MRN: [id number] Patient presents for refitting of corrected orthotics.  Patient states they all ready feel better.  Patient will call if any further problems arise.

## 2015-12-08 DIAGNOSIS — E048 Other specified nontoxic goiter: Secondary | ICD-10-CM | POA: Diagnosis not present

## 2015-12-08 DIAGNOSIS — I10 Essential (primary) hypertension: Secondary | ICD-10-CM | POA: Diagnosis not present

## 2015-12-08 DIAGNOSIS — E784 Other hyperlipidemia: Secondary | ICD-10-CM | POA: Diagnosis not present

## 2015-12-08 DIAGNOSIS — Z125 Encounter for screening for malignant neoplasm of prostate: Secondary | ICD-10-CM | POA: Diagnosis not present

## 2015-12-18 DIAGNOSIS — I341 Nonrheumatic mitral (valve) prolapse: Secondary | ICD-10-CM | POA: Diagnosis not present

## 2015-12-18 DIAGNOSIS — J309 Allergic rhinitis, unspecified: Secondary | ICD-10-CM | POA: Diagnosis not present

## 2015-12-18 DIAGNOSIS — Z0000 Encounter for general adult medical examination without abnormal findings: Secondary | ICD-10-CM | POA: Diagnosis not present

## 2015-12-18 DIAGNOSIS — Z1389 Encounter for screening for other disorder: Secondary | ICD-10-CM | POA: Diagnosis not present

## 2015-12-18 DIAGNOSIS — Z1212 Encounter for screening for malignant neoplasm of rectum: Secondary | ICD-10-CM | POA: Diagnosis not present

## 2015-12-18 DIAGNOSIS — E784 Other hyperlipidemia: Secondary | ICD-10-CM | POA: Diagnosis not present

## 2015-12-18 DIAGNOSIS — I10 Essential (primary) hypertension: Secondary | ICD-10-CM | POA: Diagnosis not present

## 2016-01-13 DIAGNOSIS — R197 Diarrhea, unspecified: Secondary | ICD-10-CM | POA: Diagnosis not present

## 2016-01-13 DIAGNOSIS — Z6821 Body mass index (BMI) 21.0-21.9, adult: Secondary | ICD-10-CM | POA: Diagnosis not present

## 2016-01-13 DIAGNOSIS — I10 Essential (primary) hypertension: Secondary | ICD-10-CM | POA: Diagnosis not present

## 2016-01-15 DIAGNOSIS — R197 Diarrhea, unspecified: Secondary | ICD-10-CM | POA: Diagnosis not present

## 2016-04-13 DIAGNOSIS — Z23 Encounter for immunization: Secondary | ICD-10-CM | POA: Diagnosis not present

## 2017-01-06 DIAGNOSIS — Z125 Encounter for screening for malignant neoplasm of prostate: Secondary | ICD-10-CM | POA: Diagnosis not present

## 2017-01-06 DIAGNOSIS — E291 Testicular hypofunction: Secondary | ICD-10-CM | POA: Diagnosis not present

## 2017-01-06 DIAGNOSIS — E038 Other specified hypothyroidism: Secondary | ICD-10-CM | POA: Diagnosis not present

## 2017-01-06 DIAGNOSIS — I10 Essential (primary) hypertension: Secondary | ICD-10-CM | POA: Diagnosis not present

## 2017-01-13 DIAGNOSIS — J3089 Other allergic rhinitis: Secondary | ICD-10-CM | POA: Diagnosis not present

## 2017-01-13 DIAGNOSIS — Z1389 Encounter for screening for other disorder: Secondary | ICD-10-CM | POA: Diagnosis not present

## 2017-01-13 DIAGNOSIS — I341 Nonrheumatic mitral (valve) prolapse: Secondary | ICD-10-CM | POA: Diagnosis not present

## 2017-01-13 DIAGNOSIS — E784 Other hyperlipidemia: Secondary | ICD-10-CM | POA: Diagnosis not present

## 2017-01-13 DIAGNOSIS — Z0000 Encounter for general adult medical examination without abnormal findings: Secondary | ICD-10-CM | POA: Diagnosis not present

## 2017-01-13 DIAGNOSIS — I10 Essential (primary) hypertension: Secondary | ICD-10-CM | POA: Diagnosis not present

## 2017-01-23 DIAGNOSIS — Z1212 Encounter for screening for malignant neoplasm of rectum: Secondary | ICD-10-CM | POA: Diagnosis not present

## 2017-03-07 DIAGNOSIS — R1013 Epigastric pain: Secondary | ICD-10-CM | POA: Diagnosis not present

## 2017-03-07 DIAGNOSIS — I10 Essential (primary) hypertension: Secondary | ICD-10-CM | POA: Diagnosis not present

## 2017-03-07 DIAGNOSIS — Z6822 Body mass index (BMI) 22.0-22.9, adult: Secondary | ICD-10-CM | POA: Diagnosis not present

## 2017-03-07 DIAGNOSIS — R197 Diarrhea, unspecified: Secondary | ICD-10-CM | POA: Diagnosis not present

## 2017-03-17 ENCOUNTER — Ambulatory Visit: Payer: Self-pay | Admitting: Gastroenterology

## 2017-03-22 DIAGNOSIS — Z23 Encounter for immunization: Secondary | ICD-10-CM | POA: Diagnosis not present

## 2017-04-12 ENCOUNTER — Ambulatory Visit (INDEPENDENT_AMBULATORY_CARE_PROVIDER_SITE_OTHER): Payer: BLUE CROSS/BLUE SHIELD | Admitting: Podiatry

## 2017-04-12 DIAGNOSIS — M67479 Ganglion, unspecified ankle and foot: Secondary | ICD-10-CM | POA: Diagnosis not present

## 2017-04-12 DIAGNOSIS — Z23 Encounter for immunization: Secondary | ICD-10-CM | POA: Diagnosis not present

## 2017-04-13 NOTE — Progress Notes (Signed)
Subjective:    Patient ID: Mark Sims, male   DOB: 51 y.o.   MRN: [id number]   HPI patient presents concerned about a cyst on the right second toe that's not painful and is been present for several months    ROS      Objective:  Physical Exam neurovascular status intact negative Homan sign was noted with patient's right second digit just below the nail bed showing a small lesion on the medial side     Assessment:   Mucoid cyst right second toe      Plan:  Reviewed mucoid cyst and recommended the continuation of conservative treatment and that he is doing a good job of managing it but I want him to use compression for proximally a month. Explained to him how to do it and if it were to get worse it will require excision and he will be seen back

## 2017-04-19 ENCOUNTER — Encounter: Payer: Self-pay | Admitting: Gastroenterology

## 2017-04-19 ENCOUNTER — Ambulatory Visit: Payer: BLUE CROSS/BLUE SHIELD | Admitting: Gastroenterology

## 2017-04-19 VITALS — BP 112/68 | HR 66 | Wt 162.0 lb

## 2017-04-19 DIAGNOSIS — R1013 Epigastric pain: Secondary | ICD-10-CM | POA: Diagnosis not present

## 2017-04-19 NOTE — Patient Instructions (Signed)
Call back for returned or ongoing symptoms.

## 2017-04-19 NOTE — Progress Notes (Addendum)
04/19/2017 Mark Sims [id number] Jan 28, 1966   HISTORY OF PRESENT ILLNESS: This is a pleasant 51 year old male who is known to Dr. Hilarie Fredrickson for previous colonoscopy.  He presented here today at the request of his PCP, Dr. Dagmar Hait, for evaluation regarding epigastric abdominal pain.  The patient tells me that this occurred on several occasions recently, where he would wake up at night with epigastric abdominal pain and bloating sensation.  He noticed that it only occurred on the nights that he ate chocolate such as Oreos or [employer] bars after dinner.  He has completely discontinued eating chocolate late at night and has had no further problems since that time.  During the time that he was having symptoms he said it would wake him around 1 AM and keep him up all night.  He said the antacids did not work.  It would cause him to be nauseous and caused him to start sweating.  He had taken Dexilant samples from his PCP for about 7 days, but did not notice much of a difference with those.  He denies any weight loss, dysphasia, decreased appetite.  Uses rare NSAIDs.  Never had any symptoms during the day and rarely has any heartburn or indigestion sensation otherwise.   Past Medical History:  Diagnosis Date  . Environmental allergies   . Hyperlipemia   . Hypertension    Past Surgical History:  Procedure Laterality Date  . NOSE SURGERY     x3; last one 1989  . TONSILLECTOMY     age 29    reports that  has never smoked. he has never used smokeless tobacco. He reports that he drinks about 0.6 oz of alcohol per week. He reports that he does not use drugs. family history includes Heart disease in his father, maternal grandfather, maternal grandmother, mother, paternal grandfather, and paternal grandmother; Kidney cancer in his father; Transient ischemic attack in his mother. Allergies  Allergen Reactions  . Sulfonamide Derivatives     REACTION: rash      Outpatient Encounter Medications as of  04/19/2017  Medication Sig  . aspirin 81 MG EC tablet Take 81 mg by mouth daily. Swallow whole.  Marland Kitchen atenolol (TENORMIN) 25 MG tablet Take by mouth daily.  Marland Kitchen atorvastatin (LIPITOR) 20 MG tablet Take 20 mg by mouth daily.  . B Complex-C (B-COMPLEX WITH VITAMIN C) tablet Take 1 tablet by mouth daily.  Marland Kitchen levothyroxine (SYNTHROID, LEVOTHROID) 25 MCG tablet Take 25 mcg by mouth daily before breakfast.  . montelukast (SINGULAIR) 10 MG tablet Take 10 mg by mouth at bedtime.  . Multiple Vitamin (MULTIVITAMIN) tablet Take 1 tablet by mouth daily.   No facility-administered encounter medications on file as of 04/19/2017.      REVIEW OF SYSTEMS  : All other systems reviewed and negative except where noted in the History of Present Illness.   PHYSICAL EXAM: BP 112/68   Pulse 66   Wt 162 lb (73.5 kg)   BMI 23.92 kg/m  General: Well developed white male in no acute distress Head: Normocephalic and atraumatic Eyes:  Sclerae anicteric, conjunctiva pink. Ears: Normal auditory acuity Lungs: Clear throughout to auscultation; no increased WOB. Heart: Regular rate and rhythm; no M/R/G. Abdomen: Soft, non-distended.  BS present.  Non-tender. Musculoskeletal: Symmetrical with no gross deformities  Skin: No lesions on visible extremities Extremities: No edema  Neurological: Alert oriented x 4, grossly non-focal Psychological:  Alert and cooperative. Normal mood and affect  ASSESSMENT AND PLAN: *Epigastric abdominal  pain:  Likely reflux related from eating chocolate later at night on a full stomach.  Now resolved after he stopped eating chocolate later at night/after dinner.  Patient is completely asymptomatic now.  He would like to hold off with any evaluation at this time.  He will call back if symptoms return despite making this dietary change at which time we will schedule him for an EGD.   CC:  Avva, Ravisankar, MD   Addendum: Reviewed and agree with initial management. Pyrtle, Lajuan Lines,  MD

## 2017-06-02 DIAGNOSIS — Z23 Encounter for immunization: Secondary | ICD-10-CM | POA: Diagnosis not present

## 2017-11-16 DIAGNOSIS — L570 Actinic keratosis: Secondary | ICD-10-CM | POA: Diagnosis not present

## 2017-11-16 DIAGNOSIS — L821 Other seborrheic keratosis: Secondary | ICD-10-CM | POA: Diagnosis not present

## 2017-11-16 DIAGNOSIS — L812 Freckles: Secondary | ICD-10-CM | POA: Diagnosis not present

## 2017-11-16 DIAGNOSIS — M7130 Other bursal cyst, unspecified site: Secondary | ICD-10-CM | POA: Diagnosis not present

## 2017-11-16 DIAGNOSIS — D1801 Hemangioma of skin and subcutaneous tissue: Secondary | ICD-10-CM | POA: Diagnosis not present

## 2018-02-09 DIAGNOSIS — E291 Testicular hypofunction: Secondary | ICD-10-CM | POA: Diagnosis not present

## 2018-02-09 DIAGNOSIS — E038 Other specified hypothyroidism: Secondary | ICD-10-CM | POA: Diagnosis not present

## 2018-02-09 DIAGNOSIS — Z0000 Encounter for general adult medical examination without abnormal findings: Secondary | ICD-10-CM | POA: Diagnosis not present

## 2018-02-09 DIAGNOSIS — Z125 Encounter for screening for malignant neoplasm of prostate: Secondary | ICD-10-CM | POA: Diagnosis not present

## 2018-02-09 DIAGNOSIS — R82998 Other abnormal findings in urine: Secondary | ICD-10-CM | POA: Diagnosis not present

## 2018-02-09 DIAGNOSIS — I10 Essential (primary) hypertension: Secondary | ICD-10-CM | POA: Diagnosis not present

## 2018-02-16 DIAGNOSIS — Z1212 Encounter for screening for malignant neoplasm of rectum: Secondary | ICD-10-CM | POA: Diagnosis not present

## 2018-02-16 DIAGNOSIS — Z0000 Encounter for general adult medical examination without abnormal findings: Secondary | ICD-10-CM | POA: Diagnosis not present

## 2018-02-16 DIAGNOSIS — I341 Nonrheumatic mitral (valve) prolapse: Secondary | ICD-10-CM | POA: Diagnosis not present

## 2018-02-16 DIAGNOSIS — E038 Other specified hypothyroidism: Secondary | ICD-10-CM | POA: Diagnosis not present

## 2018-02-16 DIAGNOSIS — I10 Essential (primary) hypertension: Secondary | ICD-10-CM | POA: Diagnosis not present

## 2018-02-16 DIAGNOSIS — E789 Disorder of lipoprotein metabolism, unspecified: Secondary | ICD-10-CM | POA: Diagnosis not present

## 2018-02-16 DIAGNOSIS — Z1389 Encounter for screening for other disorder: Secondary | ICD-10-CM | POA: Diagnosis not present

## 2018-03-09 DIAGNOSIS — Z23 Encounter for immunization: Secondary | ICD-10-CM | POA: Diagnosis not present

## 2018-11-23 ENCOUNTER — Encounter: Payer: Self-pay | Admitting: *Deleted

## 2018-11-29 ENCOUNTER — Encounter: Payer: Self-pay | Admitting: Internal Medicine

## 2018-12-17 ENCOUNTER — Encounter: Payer: Self-pay | Admitting: Internal Medicine

## 2019-01-21 ENCOUNTER — Ambulatory Visit: Payer: BC Managed Care – PPO | Admitting: *Deleted

## 2019-01-21 ENCOUNTER — Other Ambulatory Visit: Payer: Self-pay

## 2019-01-21 VITALS — Ht 70.0 in | Wt 155.0 lb

## 2019-01-21 DIAGNOSIS — Z860100 Personal history of colon polyps, unspecified: Secondary | ICD-10-CM

## 2019-01-21 MED ORDER — NA SULFATE-K SULFATE-MG SULF 17.5-3.13-1.6 GM/177ML PO SOLN: ORAL | 0 refills | Status: DC

## 2019-01-21 NOTE — Progress Notes (Signed)
Patient's pre-visit was done today over the phone with the patient due to COVID-19 pandemic. Name,DOB and address verified. Insurance verified. Patient is going to email updated insurance card to [facility]. Packet of Prep instructions mailed to patient including copy of a consent form and pre-procedure patient acknowledgement form-pt is aware. Suprep $15 off Coupon included. Patient denies any allergies to eggs or soy. Patient denies any problems with anesthesia/sedation. Patient denies any oxygen use at home. Patient denies taking any diet/weight loss medications or blood thinners. Patient understands to call us back with any questions or concerns. Pt is aware that care partner will wait in the car during procedure; if they feel like they will be too hot to wait in the car; they may wait in the lobby.  We want them to wear a mask (we do not have any that we can provide them), practice social distancing, and we will check their temperatures when they get here.  I did remind patient that their care partner needs to stay in the parking lot the entire time. Pt will wear mask into building.

## 2019-01-28 ENCOUNTER — Telehealth: Payer: Self-pay | Admitting: Internal Medicine

## 2019-01-28 NOTE — Telephone Encounter (Signed)

## 2019-01-29 ENCOUNTER — Other Ambulatory Visit: Payer: Self-pay

## 2019-01-29 ENCOUNTER — Encounter: Payer: Self-pay | Admitting: Internal Medicine

## 2019-01-29 ENCOUNTER — Ambulatory Visit (AMBULATORY_SURGERY_CENTER): Payer: BC Managed Care – PPO | Admitting: Internal Medicine

## 2019-01-29 VITALS — BP 119/71 | HR 50 | Temp 98.2°F | Resp 13 | Ht 70.0 in | Wt 162.0 lb

## 2019-01-29 DIAGNOSIS — D120 Benign neoplasm of cecum: Secondary | ICD-10-CM

## 2019-01-29 DIAGNOSIS — Z86010 Personal history of colonic polyps: Secondary | ICD-10-CM | POA: Diagnosis not present

## 2019-01-29 DIAGNOSIS — D123 Benign neoplasm of transverse colon: Secondary | ICD-10-CM

## 2019-01-29 DIAGNOSIS — Z1211 Encounter for screening for malignant neoplasm of colon: Secondary | ICD-10-CM | POA: Diagnosis not present

## 2019-01-29 MED ORDER — SODIUM CHLORIDE 0.9 % IV SOLN: 500.0000 mL | Freq: Once | INTRAVENOUS | Status: DC

## 2019-01-29 NOTE — Progress Notes (Signed)
Pt's states no medical or surgical changes since previsit or office visit. 

## 2019-01-29 NOTE — Op Note (Signed)
[facility] Patient Name: Mark Sims Procedure Date: 01/29/2019 10:43 AM MRN: [id number] Endoscopist: Jerene Bears , MD Age: 53 Referring MD:  Date of Birth: Jan 07, 1966 Gender: Male Account #: [id number] Procedure:                Colonoscopy Indications:              High risk colon cancer surveillance: Personal                            history of sessile serrated colon polyp (less than                            10 mm in size) with no dysplasia, Last colonoscopy                            5 years ago Medicines:                Monitored Anesthesia Care Procedure:                Pre-Anesthesia Assessment:                           - Prior to the procedure, a History and Physical                            was performed, and patient medications and                            allergies were reviewed. The patient's tolerance of                            previous anesthesia was also reviewed. The risks                            and benefits of the procedure and the sedation                            options and risks were discussed with the patient.                            All questions were answered, and informed consent                            was obtained. Prior Anticoagulants: The patient has                            taken no previous anticoagulant or antiplatelet                            agents. ASA Grade Assessment: II - A patient with                            mild systemic disease. After reviewing the risks  and benefits, the patient was deemed in                            satisfactory condition to undergo the procedure.                           After obtaining informed consent, the colonoscope                            was passed under direct vision. Throughout the                            procedure, the patient's blood pressure, pulse, and                            oxygen saturations were monitored continuously. The                        Colonoscope was introduced through the anus and                            advanced to the cecum, identified by appendiceal                            orifice and ileocecal valve. The colonoscopy was                            performed without difficulty. The patient tolerated                            the procedure well. The quality of the bowel                            preparation was excellent. The ileocecal valve,                            appendiceal orifice, and rectum were photographed. Scope In: 10:51:03 AM Scope Out: 11:08:23 AM Scope Withdrawal Time: 0 hours 13 minutes 29 seconds  Total Procedure Duration: 0 hours 17 minutes 20 seconds  Findings:                 The digital rectal exam was normal.                           A 4 mm polyp was found in the cecum. The polyp was                            sessile. The polyp was removed with a cold snare.                            Resection and retrieval were complete.                           Three sessile polyps were found in the transverse  colon. The polyps were 3 to 5 mm in size. These                            polyps were removed with a cold snare. Resection                            and retrieval were complete.                           A few small-mouthed diverticula were found in the                            sigmoid colon.                           Internal hemorrhoids were found during                            retroflexion. The hemorrhoids were small. Complications:            No immediate complications. Estimated Blood Loss:     Estimated blood loss was minimal. Impression:               - One 4 mm polyp in the cecum, removed with a cold                            snare. Resected and retrieved.                           - Three 3 to 5 mm polyps in the transverse colon,                            removed with a cold snare. Resected and retrieved.                            - Mild diverticulosis in the sigmoid colon.                           - Small internal hemorrhoids. Recommendation:           - Patient has a contact number available for                            emergencies. The signs and symptoms of potential                            delayed complications were discussed with the                            patient. Return to normal activities tomorrow.                            Written discharge instructions were provided to the  patient.                           - Resume previous diet.                           - Continue present medications.                           - Await pathology results.                           - Repeat colonoscopy is recommended for                            surveillance. The colonoscopy date will be                            determined after pathology results from today's                            exam become available for review. Jerene Bears, MD 01/29/2019 11:12:16 AM This report has been signed electronically.

## 2019-01-29 NOTE — Progress Notes (Signed)
Called to room to assist during endoscopic procedure.  Patient ID and intended procedure confirmed with present staff. Received instructions for my participation in the procedure from the performing physician.  

## 2019-01-29 NOTE — Patient Instructions (Signed)
Handouts given for polyps, diverticulosis and hemorrhoids  YOU HAD AN ENDOSCOPIC PROCEDURE TODAY AT THE [facility]:   Refer to the procedure report that was given to you for any specific questions about what was found during the examination.  If the procedure report does not answer your questions, please call your gastroenterologist to clarify.  If you requested that your care partner not be given the details of your procedure findings, then the procedure report has been included in a sealed envelope for you to review at your convenience later.  YOU SHOULD EXPECT: Some feelings of bloating in the abdomen. Passage of more gas than usual.  Walking can help get rid of the air that was put into your GI tract during the procedure and reduce the bloating. If you had a lower endoscopy (such as a colonoscopy or flexible sigmoidoscopy) you may notice spotting of blood in your stool or on the toilet paper. If you underwent a bowel prep for your procedure, you may not have a normal bowel movement for a few days.  Please Note:  You might notice some irritation and congestion in your nose or some drainage.  This is from the oxygen used during your procedure.  There is no need for concern and it should clear up in a day or so.  SYMPTOMS TO REPORT IMMEDIATELY:   Following lower endoscopy (colonoscopy or flexible sigmoidoscopy):  Excessive amounts of blood in the stool  Significant tenderness or worsening of abdominal pains  Swelling of the abdomen that is new, acute  Fever of 100F or higher  For urgent or emergent issues, a gastroenterologist can be reached at any hour by calling [phone].   DIET:  We do recommend a small meal at first, but then you may proceed to your regular diet.  Drink plenty of fluids but you should avoid alcoholic beverages for 24 hours.  ACTIVITY:  You should plan to take it easy for the rest of today and you should NOT DRIVE or use heavy machinery until tomorrow  (because of the sedation medicines used during the test).    FOLLOW UP: Our staff will call the number listed on your records 48-72 hours following your procedure to check on you and address any questions or concerns that you may have regarding the information given to you following your procedure. If we do not reach you, we will leave a message.  We will attempt to reach you two times.  During this call, we will ask if you have developed any symptoms of COVID 19. If you develop any symptoms (ie: fever, flu-like symptoms, shortness of breath, cough etc.) before then, please call [phone].  If you test positive for Covid 19 in the 2 weeks post procedure, please call and report this information to us.    If any biopsies were taken you will be contacted by phone or by letter within the next 1-3 weeks.  Please call us at [phone] if you have not heard about the biopsies in 3 weeks.    SIGNATURES/CONFIDENTIALITY: You and/or your care partner have signed paperwork which will be entered into your electronic medical record.  These signatures attest to the fact that that the information above on your After Visit Summary has been reviewed and is understood.  Full responsibility of the confidentiality of this discharge information lies with you and/or your care-partner. 

## 2019-01-29 NOTE — Progress Notes (Signed)
Report to PACU, RN, vss, BBS= Clear.  

## 2019-01-31 ENCOUNTER — Telehealth: Payer: Self-pay

## 2019-01-31 NOTE — Telephone Encounter (Signed)
  Follow up Call-  Call back number 01/29/2019  Post procedure Call Back phone  # [phone]  Permission to leave phone message Yes  Some recent data might be hidden     Patient questions:  Do you have a fever, pain , or abdominal swelling? No. Pain Score  0 *  Have you tolerated food without any problems? Yes.    Have you been able to return to your normal activities? Yes.    Do you have any questions about your discharge instructions: Diet   No. Medications  No. Follow up visit  No.  Do you have questions or concerns about your Care? No.  Actions: * If pain score is 4 or above: No action needed, pain <4.  1. Have you developed a fever since your procedure? no  2.   Have you had an respiratory symptoms (SOB or cough) since your procedure? no  3.   Have you tested positive for COVID 19 since your procedure no  4.   Have you had any family members/close contacts diagnosed with the COVID 19 since your procedure?  no   If yes to any of these questions please route to Joylene Burtis, RN and Alphonsa Gin, Therapist, sports.

## 2019-02-07 ENCOUNTER — Encounter: Payer: Self-pay | Admitting: Internal Medicine

## 2019-02-21 DIAGNOSIS — R82998 Other abnormal findings in urine: Secondary | ICD-10-CM | POA: Diagnosis not present

## 2019-02-21 DIAGNOSIS — E7849 Other hyperlipidemia: Secondary | ICD-10-CM | POA: Diagnosis not present

## 2019-02-21 DIAGNOSIS — Z0000 Encounter for general adult medical examination without abnormal findings: Secondary | ICD-10-CM | POA: Diagnosis not present

## 2019-02-21 DIAGNOSIS — I10 Essential (primary) hypertension: Secondary | ICD-10-CM | POA: Diagnosis not present

## 2019-02-21 DIAGNOSIS — Z125 Encounter for screening for malignant neoplasm of prostate: Secondary | ICD-10-CM | POA: Diagnosis not present

## 2019-02-21 DIAGNOSIS — E291 Testicular hypofunction: Secondary | ICD-10-CM | POA: Diagnosis not present

## 2019-02-21 DIAGNOSIS — E039 Hypothyroidism, unspecified: Secondary | ICD-10-CM | POA: Diagnosis not present

## 2019-02-26 DIAGNOSIS — I10 Essential (primary) hypertension: Secondary | ICD-10-CM | POA: Diagnosis not present

## 2019-02-26 DIAGNOSIS — E041 Nontoxic single thyroid nodule: Secondary | ICD-10-CM | POA: Diagnosis not present

## 2019-02-26 DIAGNOSIS — Z1331 Encounter for screening for depression: Secondary | ICD-10-CM | POA: Diagnosis not present

## 2019-02-26 DIAGNOSIS — E785 Hyperlipidemia, unspecified: Secondary | ICD-10-CM | POA: Diagnosis not present

## 2019-02-26 DIAGNOSIS — Z0000 Encounter for general adult medical examination without abnormal findings: Secondary | ICD-10-CM | POA: Diagnosis not present

## 2019-02-26 DIAGNOSIS — I341 Nonrheumatic mitral (valve) prolapse: Secondary | ICD-10-CM | POA: Diagnosis not present

## 2019-03-01 DIAGNOSIS — Z1212 Encounter for screening for malignant neoplasm of rectum: Secondary | ICD-10-CM | POA: Diagnosis not present

## 2019-03-11 DIAGNOSIS — L719 Rosacea, unspecified: Secondary | ICD-10-CM | POA: Diagnosis not present

## 2019-03-11 DIAGNOSIS — L812 Freckles: Secondary | ICD-10-CM | POA: Diagnosis not present

## 2019-03-11 DIAGNOSIS — L570 Actinic keratosis: Secondary | ICD-10-CM | POA: Diagnosis not present

## 2019-03-11 DIAGNOSIS — L821 Other seborrheic keratosis: Secondary | ICD-10-CM | POA: Diagnosis not present

## 2019-03-11 DIAGNOSIS — D485 Neoplasm of uncertain behavior of skin: Secondary | ICD-10-CM | POA: Diagnosis not present

## 2019-03-11 DIAGNOSIS — M7130 Other bursal cyst, unspecified site: Secondary | ICD-10-CM | POA: Diagnosis not present

## 2019-03-11 DIAGNOSIS — D1801 Hemangioma of skin and subcutaneous tissue: Secondary | ICD-10-CM | POA: Diagnosis not present

## 2019-03-19 DIAGNOSIS — Z1159 Encounter for screening for other viral diseases: Secondary | ICD-10-CM | POA: Diagnosis not present

## 2019-04-04 DIAGNOSIS — Z23 Encounter for immunization: Secondary | ICD-10-CM | POA: Diagnosis not present

## 2019-04-04 DIAGNOSIS — Z411 Encounter for cosmetic surgery: Secondary | ICD-10-CM | POA: Diagnosis not present

## 2019-04-04 DIAGNOSIS — Z4889 Encounter for other specified surgical aftercare: Secondary | ICD-10-CM | POA: Diagnosis not present

## 2019-04-04 DIAGNOSIS — Z4802 Encounter for removal of sutures: Secondary | ICD-10-CM | POA: Diagnosis not present

## 2019-04-04 DIAGNOSIS — L659 Nonscarring hair loss, unspecified: Secondary | ICD-10-CM | POA: Diagnosis not present

## 2019-09-11 DIAGNOSIS — Z23 Encounter for immunization: Secondary | ICD-10-CM | POA: Diagnosis not present

## 2019-10-09 DIAGNOSIS — Z23 Encounter for immunization: Secondary | ICD-10-CM | POA: Diagnosis not present

## 2019-12-04 DIAGNOSIS — R300 Dysuria: Secondary | ICD-10-CM | POA: Diagnosis not present

## 2020-03-06 DIAGNOSIS — N411 Chronic prostatitis: Secondary | ICD-10-CM | POA: Diagnosis not present

## 2020-03-06 DIAGNOSIS — R3912 Poor urinary stream: Secondary | ICD-10-CM | POA: Diagnosis not present

## 2020-03-06 DIAGNOSIS — Z125 Encounter for screening for malignant neoplasm of prostate: Secondary | ICD-10-CM | POA: Diagnosis not present

## 2020-03-17 DIAGNOSIS — L570 Actinic keratosis: Secondary | ICD-10-CM | POA: Diagnosis not present

## 2020-03-17 DIAGNOSIS — L281 Prurigo nodularis: Secondary | ICD-10-CM | POA: Diagnosis not present

## 2020-03-17 DIAGNOSIS — L812 Freckles: Secondary | ICD-10-CM | POA: Diagnosis not present

## 2020-03-17 DIAGNOSIS — L820 Inflamed seborrheic keratosis: Secondary | ICD-10-CM | POA: Diagnosis not present

## 2020-03-17 DIAGNOSIS — D1801 Hemangioma of skin and subcutaneous tissue: Secondary | ICD-10-CM | POA: Diagnosis not present

## 2020-03-17 DIAGNOSIS — L821 Other seborrheic keratosis: Secondary | ICD-10-CM | POA: Diagnosis not present

## 2020-03-30 DIAGNOSIS — E039 Hypothyroidism, unspecified: Secondary | ICD-10-CM | POA: Diagnosis not present

## 2020-03-30 DIAGNOSIS — Z0000 Encounter for general adult medical examination without abnormal findings: Secondary | ICD-10-CM | POA: Diagnosis not present

## 2020-03-30 DIAGNOSIS — E785 Hyperlipidemia, unspecified: Secondary | ICD-10-CM | POA: Diagnosis not present

## 2020-03-30 DIAGNOSIS — Z125 Encounter for screening for malignant neoplasm of prostate: Secondary | ICD-10-CM | POA: Diagnosis not present

## 2020-04-06 DIAGNOSIS — Z0000 Encounter for general adult medical examination without abnormal findings: Secondary | ICD-10-CM | POA: Diagnosis not present

## 2020-04-06 DIAGNOSIS — R82998 Other abnormal findings in urine: Secondary | ICD-10-CM | POA: Diagnosis not present

## 2020-04-06 DIAGNOSIS — I10 Essential (primary) hypertension: Secondary | ICD-10-CM | POA: Diagnosis not present

## 2020-04-10 ENCOUNTER — Other Ambulatory Visit: Payer: Self-pay | Admitting: Internal Medicine

## 2020-04-10 DIAGNOSIS — E785 Hyperlipidemia, unspecified: Secondary | ICD-10-CM

## 2020-04-30 ENCOUNTER — Encounter (HOSPITAL_COMMUNITY): Payer: Self-pay | Admitting: *Deleted

## 2020-04-30 ENCOUNTER — Emergency Department (HOSPITAL_COMMUNITY)
Admission: EM | Admit: 2020-04-30 | Discharge: 2020-04-30 | Disposition: A | Discharge status: Home / Self Care | Payer: BC Managed Care – PPO | Attending: Emergency Medicine | Admitting: Emergency Medicine

## 2020-04-30 ENCOUNTER — Other Ambulatory Visit: Payer: Self-pay

## 2020-04-30 ENCOUNTER — Ambulatory Visit
Admission: RE | Admit: 2020-04-30 | Discharge: 2020-04-30 | Disposition: A | Discharge status: Home / Self Care | Payer: No Typology Code available for payment source | Source: Ambulatory Visit | Attending: Internal Medicine | Admitting: Internal Medicine

## 2020-04-30 ENCOUNTER — Emergency Department (HOSPITAL_COMMUNITY): Payer: BC Managed Care – PPO

## 2020-04-30 DIAGNOSIS — Z79899 Other long term (current) drug therapy: Secondary | ICD-10-CM | POA: Insufficient documentation

## 2020-04-30 DIAGNOSIS — E7800 Pure hypercholesterolemia, unspecified: Secondary | ICD-10-CM | POA: Diagnosis not present

## 2020-04-30 DIAGNOSIS — R0789 Other chest pain: Secondary | ICD-10-CM | POA: Diagnosis not present

## 2020-04-30 DIAGNOSIS — R079 Chest pain, unspecified: Secondary | ICD-10-CM

## 2020-04-30 DIAGNOSIS — E785 Hyperlipidemia, unspecified: Secondary | ICD-10-CM

## 2020-04-30 DIAGNOSIS — I10 Essential (primary) hypertension: Secondary | ICD-10-CM | POA: Diagnosis not present

## 2020-04-30 LAB — CBC
HCT: 44 % (ref 39.0–52.0)
Hemoglobin: 14.7 g/dL (ref 13.0–17.0)
MCH: 30 pg (ref 26.0–34.0)
MCHC: 33.4 g/dL (ref 30.0–36.0)
MCV: 89.8 fL (ref 80.0–100.0)
Platelets: 159 10*3/uL (ref 150–400)
RBC: 4.9 MIL/uL (ref 4.22–5.81)
RDW: 11.9 % (ref 11.5–15.5)
WBC: 6 10*3/uL (ref 4.0–10.5)
nRBC: 0 % (ref 0.0–0.2)

## 2020-04-30 LAB — TROPONIN I (HIGH SENSITIVITY)
Troponin I (High Sensitivity): 5 ng/L (ref <18)
Troponin I (High Sensitivity): 5 ng/L (ref <18)

## 2020-04-30 LAB — BASIC METABOLIC PANEL
Anion gap: 8 (ref 5–15)
BUN: 16 mg/dL (ref 6–20)
CO2: 26 mmol/L (ref 22–32)
Calcium: 9.4 mg/dL (ref 8.9–10.3)
Chloride: 107 mmol/L (ref 98–111)
Creatinine, Ser: 1.05 mg/dL (ref 0.61–1.24)
GFR, Estimated: >60 mL/min (ref >60)
Glucose, Bld: 124 mg/dL — ABNORMAL HIGH (ref 70–99)
Potassium: 4.6 mmol/L (ref 3.5–5.1)
Sodium: 141 mmol/L (ref 135–145)

## 2020-04-30 LAB — D-DIMER, QUANTITATIVE: D-Dimer, Quant: <0.27 ug/mL-FEU (ref 0.00–0.50)

## 2020-04-30 MED ORDER — NITROGLYCERIN 0.4 MG SL SUBL
0.4000 mg | SUBLINGUAL_TABLET | SUBLINGUAL | Status: DC | PRN
  Administered 2020-04-30: 0.4 mg via SUBLINGUAL
  Filled 2020-04-30: qty 1

## 2020-04-30 NOTE — ED Provider Notes (Signed)
[facility] EMERGENCY DEPARTMENT Provider Note   CSN: [id number] Arrival date & time: 04/30/20  0631     History Chief Complaint  Patient presents with  . Chest Pain    ZIARE CRYDER is a 54 y.o. male with PMH/o HTN, HLD who presents for evaluation of chest pain.  He states it initially started about 2 weeks ago.  He states that he was going about his normal business when it started.  He did not have any associated diaphoresis, nausea/vomiting or shortness of breath.  He states it was just kind of a dull ache type sensation and a pressure to the left side of his chest.  He states it has been pretty consistent over the last 2 weeks.  He states that he started noticing it go to his back and his left upper extremity.  He states that it has been constant but then every once a while, he will get twinges of sharp pain.  He states that he is just felt more tired than normal but has not had any shortness of breath, exertional symptoms.  He states he was able to play 18 rounds of golf yesterday and states that did not necessarily make the pain worse.  He states that is not worsened by deep inspiration or exertion.  He has not noticed any particular action or exertional activity that makes it worse.  He states it is not affected by movement.  He states every once in a while, he will just feel achy but he is concerned because he feels like it is "somebody sitting on his chest."  He says he has been stressed recently.  His dad recently passed away and has been helping his mom move to [place].  He does not smoke and denies any illicit drug use.  He has a history of hypertension and is on a beta-blocker for tachycardia that they believe was secondary to [facility] mitral valve prolapse.  He states he has history of hyperlipidemia but he has been wearing to control it and has been taken off his Lipitor.  No personal cardiac history.  No family history of MI before the age of 53.  He has a history of  high blood pressure.  No history of diabetes. He denies any exogenous hormone use, recent immobilization, prior history of DVT/PE, recent surgery, leg swelling. He did recently travel to [place] via a 2 hr flight.   The history is provided by the patient.    HPI: A 54 year old patient with a history of hypertension presents for evaluation of chest pain. Initial onset of pain was less than one hour ago. The patient's chest pain is described as heaviness/pressure/tightness and is not worse with exertion. The patient's chest pain is not middle- or left-sided, is not well-localized, is not sharp and does radiate to the arms/jaw/neck. The patient does not complain of nausea and denies diaphoresis. The patient has no history of stroke, has no history of peripheral artery disease, has not smoked in the past 90 days, denies any history of treated diabetes, has no relevant family history of coronary artery disease (first degree relative at less than age 51), has no history of hypercholesterolemia and does not have an elevated BMI (>=30).   Past Medical History:  Diagnosis Date  . Environmental allergies   . Hyperlipemia   . Hypertension     Patient Active Problem List   Diagnosis Date Noted  . Abdominal pain, epigastric 04/19/2017  . CHEST PAIN 05/10/2010  .  HYPERLIPIDEMIA 11/01/2006  . HYPERTENSION 11/01/2006    Past Surgical History:  Procedure Laterality Date  . COLONOSCOPY  12/31/2013  . NOSE SURGERY     x3; last one 1989  . POLYPECTOMY    . TONSILLECTOMY     age 69       Family History  Problem Relation Age of Onset  . Heart disease Mother   . Transient ischemic attack Mother   . Kidney cancer Father   . Heart disease Father   . Heart disease Maternal Grandmother   . Heart disease Maternal Grandfather   . Heart disease Paternal Grandmother   . Heart disease Paternal Grandfather   . Colon polyps Brother   . Colon cancer Neg Hx   . Pancreatic cancer Neg Hx   . Rectal cancer Neg  Hx   . Stomach cancer Neg Hx     Social History   Tobacco Use  . Smoking status: Never Smoker  . Smokeless tobacco: Never Used  Vaping Use  . Vaping Use: Never used  Substance Use Topics  . Alcohol use: Yes    Alcohol/week: 1.0 standard drink    Types: 1 Standard drinks or equivalent per week  . Drug use: No    Home Medications Prior to Admission medications   Medication Sig Start Date End Date Taking? Authorizing Provider  atenolol (TENORMIN) 25 MG tablet Take by mouth daily.   Yes [provider]  B Complex-C (B-COMPLEX WITH VITAMIN C) tablet Take 1 tablet by mouth daily.   Yes [provider]  Coenzyme Q10 (COQ-10 PO) Take 1 tablet by mouth.   Yes [provider]  levothyroxine (SYNTHROID, LEVOTHROID) 25 MCG tablet Take 25 mcg by mouth daily before breakfast.   Yes [provider]  montelukast (SINGULAIR) 10 MG tablet Take 10 mg by mouth at bedtime.   Yes [provider]  Multiple Vitamin (MULTIVITAMIN) tablet Take 1 tablet by mouth daily.   Yes [provider]  Red Yeast Rice 600 MG TABS Take 600 mg by mouth in the morning and at bedtime.   Yes [provider]  VITAMIN D PO Take by mouth.   Yes [provider]    Allergies    Sulfa antibiotics and Sulfonamide derivatives  Review of Systems   Review of Systems  Constitutional: Negative for fever.  Respiratory: Negative for cough and shortness of breath.   Cardiovascular: Positive for chest pain.  Gastrointestinal: Negative for abdominal pain, nausea and vomiting.  Genitourinary: Negative for dysuria and hematuria.  Neurological: Negative for weakness, numbness and headaches.  All other systems reviewed and are negative.   Physical Exam Updated Vital Signs BP 104/74   Pulse (!) 44   Temp 98.1 F (36.7 C) (Oral)   Resp 16   Ht 5\' 9"  (1.753 m)   Wt 70.3 kg   SpO2 98%   BMI 22.89 kg/m   Physical Exam Vitals and nursing note reviewed.    Constitutional:      Appearance: Normal appearance. He is well-developed.  HENT:     Head: Normocephalic and atraumatic.  Eyes:     General: Lids are normal.     Conjunctiva/sclera: Conjunctivae normal.     Pupils: Pupils are equal, round, and reactive to light.  Cardiovascular:     Rate and Rhythm: Normal rate and regular rhythm.     Pulses: Normal pulses.          Radial pulses are 2+ on the right side  and 2+ on the left side.       Dorsalis pedis pulses are 2+ on the right side and 2+ on the left side.     Heart sounds: Normal heart sounds. No murmur heard.  No friction rub. No gallop.   Pulmonary:     Effort: Pulmonary effort is normal.     Breath sounds: Normal breath sounds.     Comments: Lungs clear to auscultation bilaterally.  Symmetric chest rise.  No wheezing, rales, rhonchi. Abdominal:     Palpations: Abdomen is soft. Abdomen is not rigid.     Tenderness: There is no abdominal tenderness. There is no guarding.     Comments: Abdomen is soft, non-distended, non-tender. No rigidity, No guarding. No peritoneal signs.  Musculoskeletal:        General: Normal range of motion.     Cervical back: Full passive range of motion without pain.  Skin:    General: Skin is warm and dry.     Capillary Refill: Capillary refill takes less than 2 seconds.     Comments: Good distal cap refill. LUE is not dusky in appearance or cool to touch.  Neurological:     Mental Status: He is alert and oriented to person, place, and time.  Psychiatric:        Speech: Speech normal.     ED Results / Procedures / Treatments   Labs (all labs ordered are listed, but only abnormal results are displayed) Labs Reviewed  BASIC METABOLIC PANEL - Abnormal; Notable for the following components:      Result Value   Glucose, Bld 124 (*)    All other components within normal limits  CBC  D-DIMER, QUANTITATIVE (NOT AT [facility])  TROPONIN I (HIGH SENSITIVITY)  TROPONIN I (HIGH SENSITIVITY)    EKG EKG  Interpretation  Date/Time:  Thursday April 30 2020 06:31:04 EST Ventricular Rate:  54 PR Interval:  142 QRS Duration: 94 QT Interval:  448 QTC Calculation: 424 R Axis:   75 Text Interpretation: Sinus bradycardia with Premature atrial complexes Otherwise normal ECG No prior ecg for comparison No STEMI Confirmed by Octaviano Glow [id number]) on 04/30/2020 7:02:05 AM Also confirmed by Octaviano Glow [phone]), editor Hattie Perch ([id number])  on 04/30/2020 8:25:00 AM   Radiology DG Chest 2 View  Result Date: 04/30/2020 CLINICAL DATA:  Chest pain for weeks EXAM: CHEST - 2 VIEW COMPARISON:  None. FINDINGS: Normal heart size and mediastinal contours. No acute infiltrate or edema. No effusion or pneumothorax. No acute osseous findings. IMPRESSION: Negative chest. Electronically Signed   By: Monte Fantasia M.D.   On: 04/30/2020 07:13    Procedures Procedures (including critical care time)  Medications Ordered in ED Medications  nitroGLYCERIN (NITROSTAT) SL tablet 0.4 mg (0.4 mg Sublingual Given 04/30/20 0851)    ED Course  I have reviewed the triage vital signs and the nursing notes.  Pertinent labs & imaging results that were available during my care of the patient were reviewed by me and considered in my medical decision making (see chart for details).  Clinical Course as of Apr 30 1353  Thu Apr 30, 2020  1245 Is a 54 year old male with a history of hypertension on atenolol, borderline high cholesterol, presented emergency department with left-sided chest pressure for 2 weeks.  He reports he has had persistent pressure sensation and heaviness in the left side of his chest radiating towards the shoulder and left arm for 2 weeks.  Nothing seems to make it better or  worse.  He did play a round of golf recently, and had no worsening symptoms at that time.  He has no prior history of similar symptoms or angina.  He has no personal risk factors for DVT or PE.  He has no family history or  personal risk factors for aortic dissection, including smoking.  He last had a stress test approximately 10 years ago, but has never seen a cardiologist since then.  On exam the patient appears extremely comfortable.  He is regular rate and rhythm no cardiac murmur.  His lungs are clear to auscultation.  I cannot reproduce his pain with any movement or palpation.  His EKG here shows sinus bradycardia, which could be fitting with a fairly athletic 54 year old male.   [MT]  [phone] We will try a dose of nitroglycerin to see if it relieves his chest pressure.  His work-up otherwise is fairly unremarkable, with a troponin of 5.  I will also add on a D-dimer.   [MT]  [id number] HEART Score < 3, however given that he has persistent pain, we will discuss the case with cardiology   [MT]  [phone] Ddimer negative.     [MT]  3154 PA provider discussed with cardiology.  This patient apparently has a CT coronary angiogram scheduled for later today.  He had no favorable response to nitroglycerin.  I think it is reasonable to discharge him given that he has this close outpatient follow-up.   [MT]    Clinical Course User Index [MT] Trifan, Carola Rhine, MD   MDM Rules/Calculators/A&P HEAR Score: 3                        54 y.o. M with PMH/o HTN, HLD who presents for evaluation of chest pain x 2 weeks. He states it has been fairly constant but every once in awhile will have twinges or worsening pain. Not related to exertional activity. He has just felt tired. No SOB, nausea/vomiting.  On initial arrival, he is afebrile, nontoxic-appearing.  Vital signs are stable.  He is slightly bradycardic but he is on a beta-blocker.  Consider ACS though lower suspicion given that he does not have any exertional pain.  Doubt PE.  He does not have any pleuritic chest pain but did recently travel to [place].  Additionally, doubt dissection.  He is overall well-appearing.  He has equal pulses in all 4 extremities.  Plan to check labs, chest  x-ray.  Initial troponin is negative.  CBC shows no leukocytosis.  Hemoglobin stable at 14.7.  BMP shows normal BUN and creatinine.  Dimer is negative.  Chest x-ray is negative.  I reevaluated patient after nitro given.  He states it had no effect on his pain.  He states pain is currently 1/10.  Will consult cardiology.  Discussed with Dr. Tamala Julian who reviewed his EKG and work-up here.  He feels that if the second troponin is negative, patient to follow-up with cards on an outpatient basis.  Delta troponin is negative.  Reevaluation.  Patient resting comfortably on bed.  He is hemodynamically stable.  Patient has an appointment at 2 PM this evening for a coronary CT scan that was arranged by his primary care doctor.  I encouraged him to keep that appointment.  At this time, patient stable for outpatient follow-up with cardiology.  Instructed patient to call cardiology for evaluation. Discussed patient with Dr. Langston Masker who is agreeable. Patient had ample opportunity for questions and discussion. All patient's  questions were answered with full understanding. Strict return precautions discussed. Patient expresses understanding and agreement to plan.   Portions of this note were generated with [employer]. Dictation errors may occur despite best attempts at proofreading.   Final Clinical Impression(s) / ED Diagnoses Final diagnoses:  Nonspecific chest pain    Rx / DC Orders ED Discharge Orders    None       Volanda Napoleon, PA-C 04/30/20 1354    Wyvonnia Dusky, MD 04/30/20 1815

## 2020-04-30 NOTE — ED Triage Notes (Signed)
Pt reports left sided chest pain "for weeks", pain radiates to the back and left arm.

## 2020-04-30 NOTE — ED Notes (Signed)
Patient transported to X-ray via wheelchair 

## 2020-04-30 NOTE — Discharge Instructions (Signed)
As we discussed, your work-up today was reassuring.  We will have you follow-up with the heart doctors on an outpatient basis.  Please call them and arrange for an outpatient appointment.  Go to your CT scan as directed today.  Return to the Emergency Department immediately if you experiencing worsening chest pain, difficulty breathing, nausea/vomiting, get very sweaty, headache or any other worsening or concerning symptoms.

## 2020-05-15 DIAGNOSIS — Z1212 Encounter for screening for malignant neoplasm of rectum: Secondary | ICD-10-CM | POA: Diagnosis not present

## 2020-07-07 DIAGNOSIS — E785 Hyperlipidemia, unspecified: Secondary | ICD-10-CM | POA: Diagnosis not present

## 2020-07-30 DIAGNOSIS — L570 Actinic keratosis: Secondary | ICD-10-CM | POA: Diagnosis not present

## 2020-07-30 DIAGNOSIS — L738 Other specified follicular disorders: Secondary | ICD-10-CM | POA: Diagnosis not present

## 2021-03-02 DIAGNOSIS — R8271 Bacteriuria: Secondary | ICD-10-CM | POA: Diagnosis not present

## 2021-03-02 DIAGNOSIS — N411 Chronic prostatitis: Secondary | ICD-10-CM | POA: Diagnosis not present

## 2021-03-02 DIAGNOSIS — N401 Enlarged prostate with lower urinary tract symptoms: Secondary | ICD-10-CM | POA: Diagnosis not present

## 2021-03-02 DIAGNOSIS — R3912 Poor urinary stream: Secondary | ICD-10-CM | POA: Diagnosis not present

## 2021-03-23 DIAGNOSIS — D225 Melanocytic nevi of trunk: Secondary | ICD-10-CM | POA: Diagnosis not present

## 2021-03-23 DIAGNOSIS — L812 Freckles: Secondary | ICD-10-CM | POA: Diagnosis not present

## 2021-03-23 DIAGNOSIS — D1801 Hemangioma of skin and subcutaneous tissue: Secondary | ICD-10-CM | POA: Diagnosis not present

## 2021-03-23 DIAGNOSIS — L821 Other seborrheic keratosis: Secondary | ICD-10-CM | POA: Diagnosis not present

## 2021-04-22 DIAGNOSIS — R945 Abnormal results of liver function studies: Secondary | ICD-10-CM | POA: Diagnosis not present

## 2021-04-22 DIAGNOSIS — E785 Hyperlipidemia, unspecified: Secondary | ICD-10-CM | POA: Diagnosis not present

## 2021-05-31 DIAGNOSIS — R3912 Poor urinary stream: Secondary | ICD-10-CM | POA: Diagnosis not present

## 2021-05-31 DIAGNOSIS — N401 Enlarged prostate with lower urinary tract symptoms: Secondary | ICD-10-CM | POA: Diagnosis not present

## 2021-07-23 DIAGNOSIS — E291 Testicular hypofunction: Secondary | ICD-10-CM | POA: Diagnosis not present

## 2021-07-23 DIAGNOSIS — Z125 Encounter for screening for malignant neoplasm of prostate: Secondary | ICD-10-CM | POA: Diagnosis not present

## 2021-07-23 DIAGNOSIS — E785 Hyperlipidemia, unspecified: Secondary | ICD-10-CM | POA: Diagnosis not present

## 2021-07-23 DIAGNOSIS — E039 Hypothyroidism, unspecified: Secondary | ICD-10-CM | POA: Diagnosis not present

## 2021-07-23 DIAGNOSIS — E298 Other testicular dysfunction: Secondary | ICD-10-CM | POA: Diagnosis not present

## 2021-08-13 DIAGNOSIS — Z1339 Encounter for screening examination for other mental health and behavioral disorders: Secondary | ICD-10-CM | POA: Diagnosis not present

## 2021-08-13 DIAGNOSIS — I10 Essential (primary) hypertension: Secondary | ICD-10-CM | POA: Diagnosis not present

## 2021-08-13 DIAGNOSIS — Z0000 Encounter for general adult medical examination without abnormal findings: Secondary | ICD-10-CM | POA: Diagnosis not present

## 2021-08-13 DIAGNOSIS — Z1331 Encounter for screening for depression: Secondary | ICD-10-CM | POA: Diagnosis not present

## 2021-12-30 DIAGNOSIS — E785 Hyperlipidemia, unspecified: Secondary | ICD-10-CM | POA: Diagnosis not present

## 2021-12-30 DIAGNOSIS — D72819 Decreased white blood cell count, unspecified: Secondary | ICD-10-CM | POA: Diagnosis not present

## 2021-12-30 DIAGNOSIS — Z01818 Encounter for other preprocedural examination: Secondary | ICD-10-CM | POA: Diagnosis not present

## 2021-12-30 DIAGNOSIS — I341 Nonrheumatic mitral (valve) prolapse: Secondary | ICD-10-CM | POA: Diagnosis not present

## 2022-01-13 DIAGNOSIS — I10 Essential (primary) hypertension: Secondary | ICD-10-CM | POA: Diagnosis not present

## 2022-01-13 DIAGNOSIS — I341 Nonrheumatic mitral (valve) prolapse: Secondary | ICD-10-CM | POA: Diagnosis not present

## 2022-01-13 DIAGNOSIS — Z1339 Encounter for screening examination for other mental health and behavioral disorders: Secondary | ICD-10-CM | POA: Diagnosis not present

## 2022-03-17 DIAGNOSIS — Z23 Encounter for immunization: Secondary | ICD-10-CM | POA: Diagnosis not present

## 2022-04-11 DIAGNOSIS — D1801 Hemangioma of skin and subcutaneous tissue: Secondary | ICD-10-CM | POA: Diagnosis not present

## 2022-04-11 DIAGNOSIS — L570 Actinic keratosis: Secondary | ICD-10-CM | POA: Diagnosis not present

## 2022-04-11 DIAGNOSIS — L812 Freckles: Secondary | ICD-10-CM | POA: Diagnosis not present

## 2022-04-11 DIAGNOSIS — E785 Hyperlipidemia, unspecified: Secondary | ICD-10-CM | POA: Diagnosis not present

## 2022-04-11 DIAGNOSIS — L821 Other seborrheic keratosis: Secondary | ICD-10-CM | POA: Diagnosis not present

## 2022-06-16 IMAGING — CT CT CARDIAC CORONARY ARTERY CALCIUM SCORE
3 series · 13 of 20 positions shown, 15 images · non-contrast
Comparison: None.

CLINICAL DATA: Hyperlipidemia

EXAM:
CT CARDIAC CORONARY ARTERY CALCIUM SCORE
TECHNIQUE: Non-contrast imaging through the heart was performed using
prospective ECG gating. Image post processing was performed on an
independent workstation, allowing for quantitative analysis of the
heart and coronary arteries. Note that this exam targets the heart
and the chest was not imaged in its entirety.

[Series 2: calcium scoring 2.00 qr36 bestdiast 69% hrt calciu · axial · 0.34mm/px · z∈[+1501,+1557]mm · 3 of 70 slices shown]
[im 14/70  vessel]
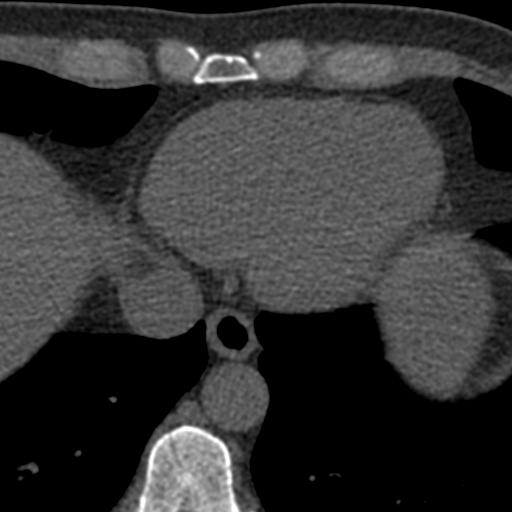
[im 28/70  vessel]
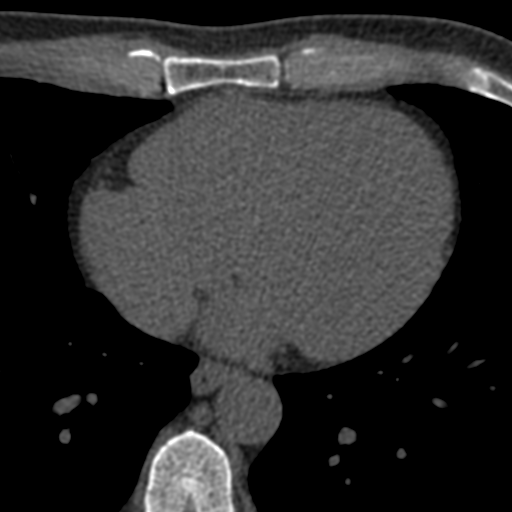
[im 42/70  vessel]
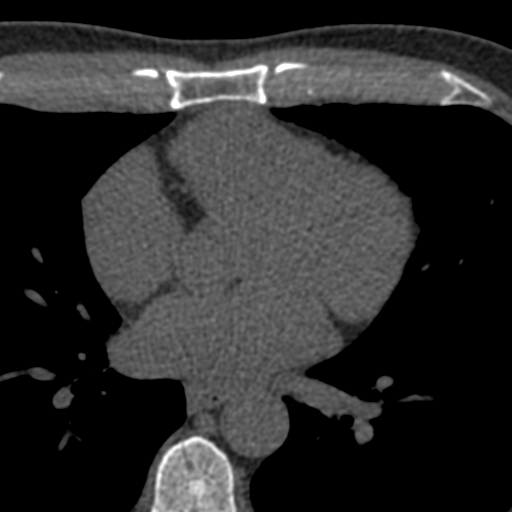

[Series 3: calcium scoring 2.00 br40 bestdiast 69% axial · axial · 0.51mm/px · z∈[+1497,+1589]mm · 5 of 70 slices shown, 7 images]
[im 12/70  vessel]
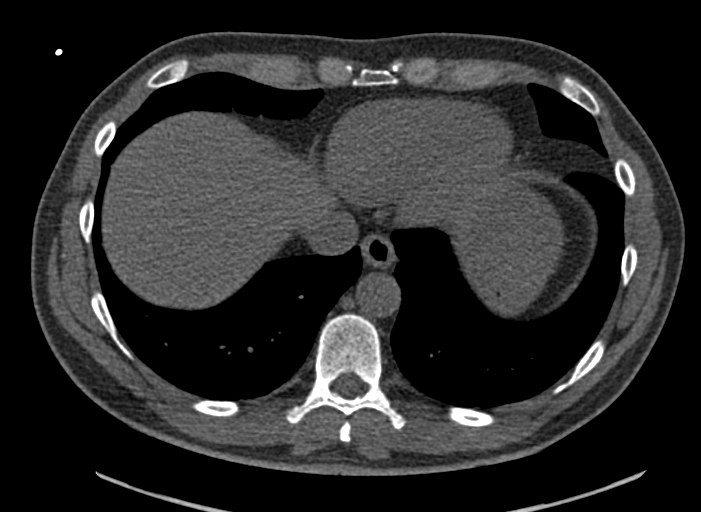
[im 12/70  lung]
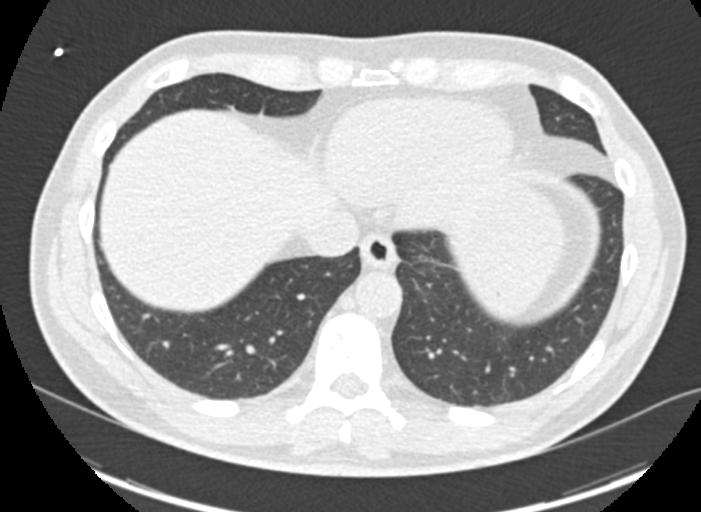
[im 24/70  vessel]
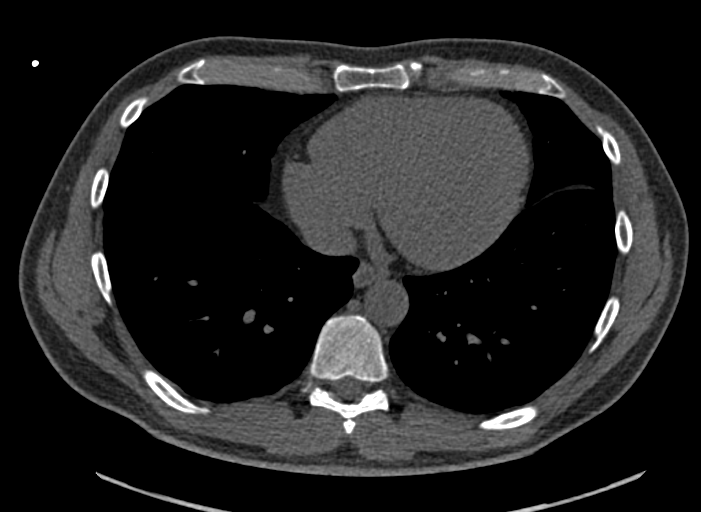
[im 35/70  vessel]
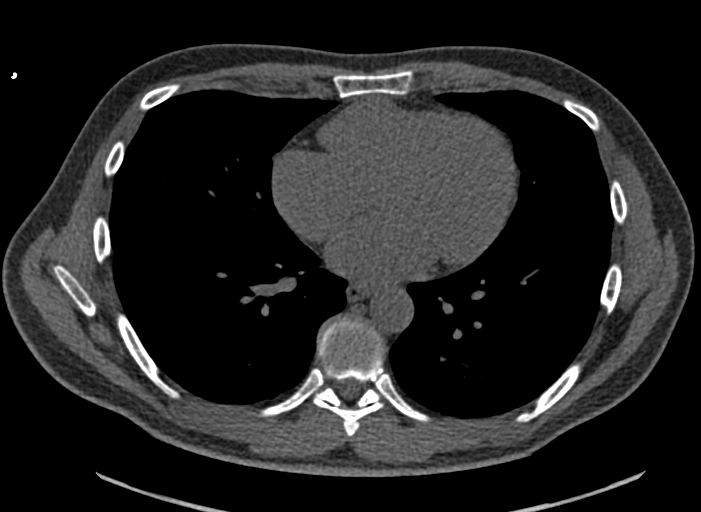
[im 47/70  vessel]
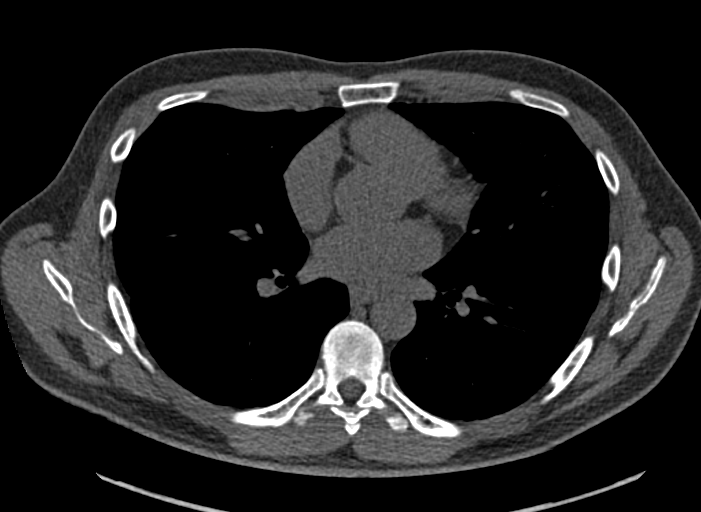
[im 58/70  vessel]
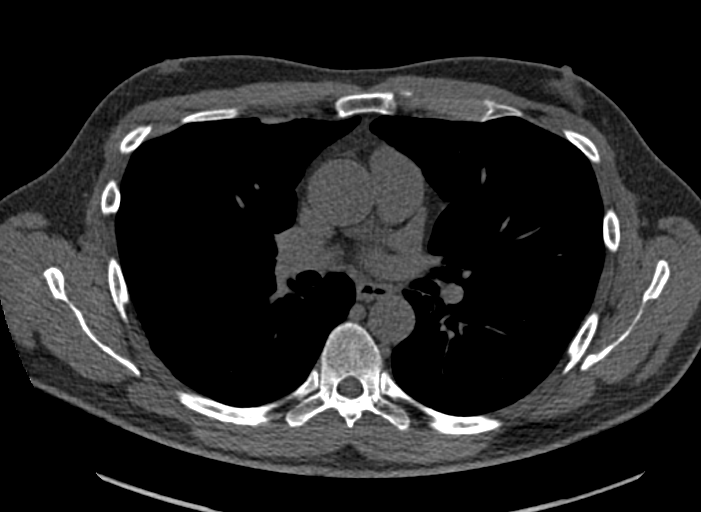
[im 58/70  lung]
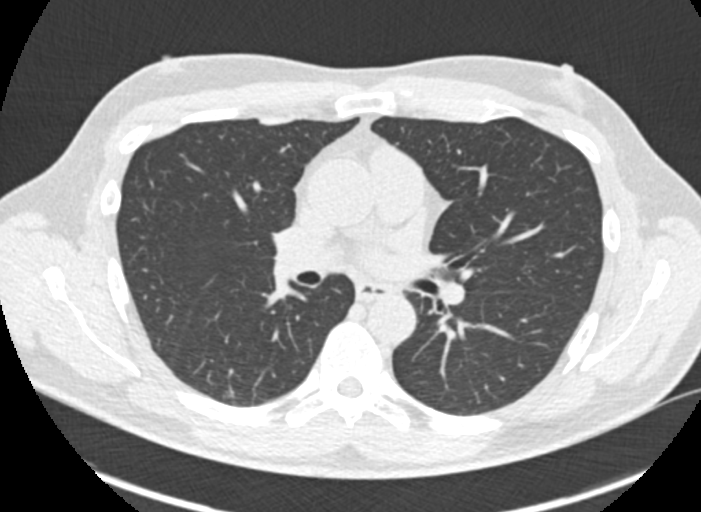

[Series 9: calcium scoring 2.00 br60 bestdiast 69% lungs · axial · 0.51mm/px · z∈[+1497,+1589]mm · 5 of 70 slices shown]
[im 12/70  vessel]
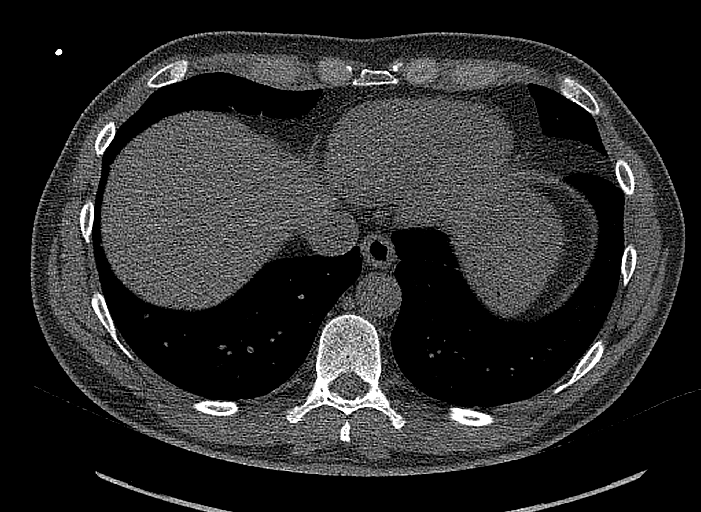
[im 24/70  vessel]
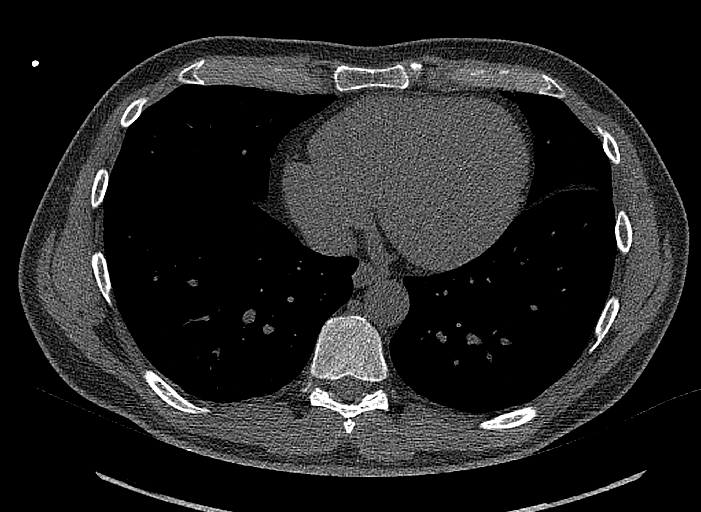
[im 35/70  vessel]
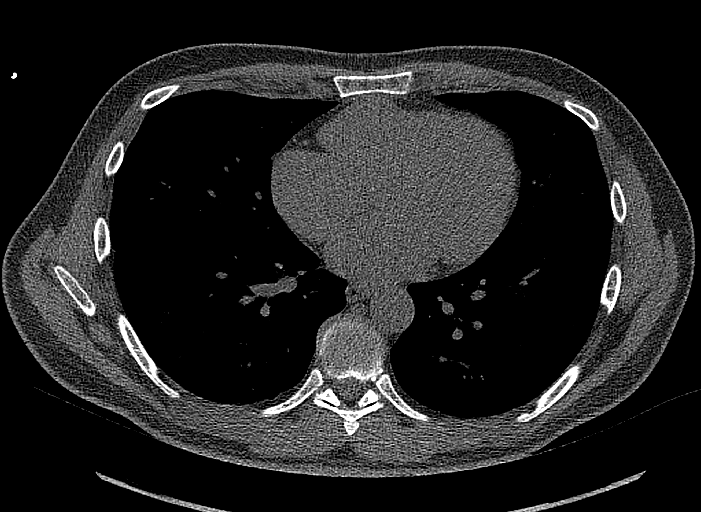
[im 47/70  vessel]
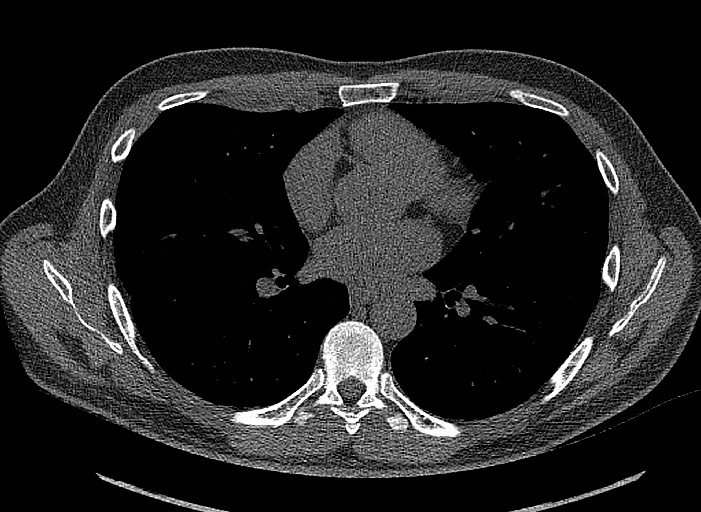
[im 58/70  vessel]
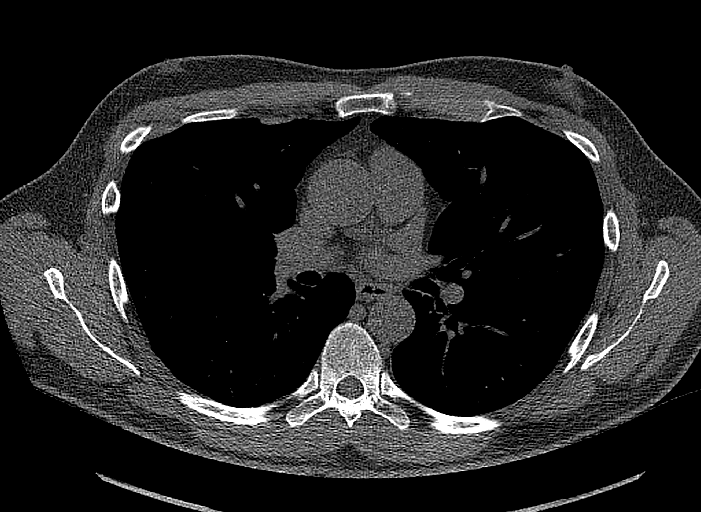

[13 of 20 positions shown; findings below may reference images not displayed]

FINDINGS: CORONARY CALCIUM SCORES:

Left Main: 0

LAD: 0

LCx: 0

RCA: 0

Total Agatston Score: 0

[HOSPITAL] percentile: 0

AORTA MEASUREMENTS:

Ascending Aorta: 35 mm

Descending Aorta: 23 mm

OTHER FINDINGS:

Heart is normal size. Scattered calcification in the aortic root. No
adenopathy. Visualized lungs clear. Imaging into the upper abdomen
demonstrates no acute findings. Chest wall soft tissues are
unremarkable. No acute bony abnormality.
IMPRESSION: No visible coronary artery calcifications. Total coronary calcium
score of 0.

Focal calcification in the aortic root.

No acute extra cardiac abnormality.

## 2022-09-26 DIAGNOSIS — R7989 Other specified abnormal findings of blood chemistry: Secondary | ICD-10-CM | POA: Diagnosis not present

## 2022-09-26 DIAGNOSIS — I10 Essential (primary) hypertension: Secondary | ICD-10-CM | POA: Diagnosis not present

## 2022-09-26 DIAGNOSIS — D72819 Decreased white blood cell count, unspecified: Secondary | ICD-10-CM | POA: Diagnosis not present

## 2022-09-26 DIAGNOSIS — Z1212 Encounter for screening for malignant neoplasm of rectum: Secondary | ICD-10-CM | POA: Diagnosis not present

## 2022-09-26 DIAGNOSIS — E039 Hypothyroidism, unspecified: Secondary | ICD-10-CM | POA: Diagnosis not present

## 2022-09-26 DIAGNOSIS — Z125 Encounter for screening for malignant neoplasm of prostate: Secondary | ICD-10-CM | POA: Diagnosis not present

## 2022-09-26 DIAGNOSIS — E785 Hyperlipidemia, unspecified: Secondary | ICD-10-CM | POA: Diagnosis not present

## 2022-10-03 DIAGNOSIS — E785 Hyperlipidemia, unspecified: Secondary | ICD-10-CM | POA: Diagnosis not present

## 2022-10-03 DIAGNOSIS — Z23 Encounter for immunization: Secondary | ICD-10-CM | POA: Diagnosis not present

## 2022-10-03 DIAGNOSIS — I341 Nonrheumatic mitral (valve) prolapse: Secondary | ICD-10-CM | POA: Diagnosis not present

## 2022-10-03 DIAGNOSIS — E039 Hypothyroidism, unspecified: Secondary | ICD-10-CM | POA: Diagnosis not present

## 2022-10-03 DIAGNOSIS — Z1339 Encounter for screening examination for other mental health and behavioral disorders: Secondary | ICD-10-CM | POA: Diagnosis not present

## 2022-10-03 DIAGNOSIS — I10 Essential (primary) hypertension: Secondary | ICD-10-CM | POA: Diagnosis not present

## 2022-10-03 DIAGNOSIS — Z0000 Encounter for general adult medical examination without abnormal findings: Secondary | ICD-10-CM | POA: Diagnosis not present

## 2022-10-03 DIAGNOSIS — R82998 Other abnormal findings in urine: Secondary | ICD-10-CM | POA: Diagnosis not present

## 2022-10-03 DIAGNOSIS — Z1331 Encounter for screening for depression: Secondary | ICD-10-CM | POA: Diagnosis not present

## 2022-11-16 DIAGNOSIS — D72819 Decreased white blood cell count, unspecified: Secondary | ICD-10-CM | POA: Diagnosis not present

## 2022-11-16 DIAGNOSIS — I10 Essential (primary) hypertension: Secondary | ICD-10-CM | POA: Diagnosis not present

## 2023-01-05 DIAGNOSIS — I341 Nonrheumatic mitral (valve) prolapse: Secondary | ICD-10-CM | POA: Diagnosis not present

## 2023-01-05 DIAGNOSIS — I10 Essential (primary) hypertension: Secondary | ICD-10-CM | POA: Diagnosis not present

## 2023-04-05 DIAGNOSIS — Z23 Encounter for immunization: Secondary | ICD-10-CM | POA: Diagnosis not present

## 2023-05-04 DIAGNOSIS — D225 Melanocytic nevi of trunk: Secondary | ICD-10-CM | POA: Diagnosis not present

## 2023-05-04 DIAGNOSIS — L812 Freckles: Secondary | ICD-10-CM | POA: Diagnosis not present

## 2023-06-09 DIAGNOSIS — J0100 Acute maxillary sinusitis, unspecified: Secondary | ICD-10-CM | POA: Diagnosis not present

## 2023-07-04 DIAGNOSIS — J329 Chronic sinusitis, unspecified: Secondary | ICD-10-CM | POA: Diagnosis not present

## 2023-07-12 DIAGNOSIS — J329 Chronic sinusitis, unspecified: Secondary | ICD-10-CM | POA: Diagnosis not present

## 2023-07-12 DIAGNOSIS — J3489 Other specified disorders of nose and nasal sinuses: Secondary | ICD-10-CM | POA: Diagnosis not present

## 2023-10-09 DIAGNOSIS — E785 Hyperlipidemia, unspecified: Secondary | ICD-10-CM | POA: Diagnosis not present

## 2023-10-09 DIAGNOSIS — R972 Elevated prostate specific antigen [PSA]: Secondary | ICD-10-CM | POA: Diagnosis not present

## 2023-10-09 DIAGNOSIS — I10 Essential (primary) hypertension: Secondary | ICD-10-CM | POA: Diagnosis not present

## 2023-10-09 DIAGNOSIS — E291 Testicular hypofunction: Secondary | ICD-10-CM | POA: Diagnosis not present

## 2023-10-09 DIAGNOSIS — E039 Hypothyroidism, unspecified: Secondary | ICD-10-CM | POA: Diagnosis not present

## 2023-10-09 DIAGNOSIS — Z1212 Encounter for screening for malignant neoplasm of rectum: Secondary | ICD-10-CM | POA: Diagnosis not present

## 2023-10-16 DIAGNOSIS — Z23 Encounter for immunization: Secondary | ICD-10-CM | POA: Diagnosis not present

## 2023-10-16 DIAGNOSIS — I10 Essential (primary) hypertension: Secondary | ICD-10-CM | POA: Diagnosis not present

## 2023-10-16 DIAGNOSIS — R82998 Other abnormal findings in urine: Secondary | ICD-10-CM | POA: Diagnosis not present

## 2023-10-16 DIAGNOSIS — Z1331 Encounter for screening for depression: Secondary | ICD-10-CM | POA: Diagnosis not present

## 2023-10-16 DIAGNOSIS — Z1339 Encounter for screening examination for other mental health and behavioral disorders: Secondary | ICD-10-CM | POA: Diagnosis not present

## 2023-10-16 DIAGNOSIS — Z0000 Encounter for general adult medical examination without abnormal findings: Secondary | ICD-10-CM | POA: Diagnosis not present

## 2024-03-19 DIAGNOSIS — Z23 Encounter for immunization: Secondary | ICD-10-CM | POA: Diagnosis not present

## 2024-04-17 DIAGNOSIS — Z125 Encounter for screening for malignant neoplasm of prostate: Secondary | ICD-10-CM | POA: Diagnosis not present

## 2024-05-16 DIAGNOSIS — L812 Freckles: Secondary | ICD-10-CM | POA: Diagnosis not present

## 2024-05-16 DIAGNOSIS — L821 Other seborrheic keratosis: Secondary | ICD-10-CM | POA: Diagnosis not present

## 2024-05-16 DIAGNOSIS — D225 Melanocytic nevi of trunk: Secondary | ICD-10-CM | POA: Diagnosis not present

## 2024-05-16 DIAGNOSIS — L570 Actinic keratosis: Secondary | ICD-10-CM | POA: Diagnosis not present

## 2024-06-25 ENCOUNTER — Encounter: Payer: Self-pay | Admitting: Internal Medicine

## 2024-07-18 ENCOUNTER — Ambulatory Visit: Admitting: *Deleted

## 2024-07-18 VITALS — Ht 70.0 in | Wt 155.0 lb

## 2024-07-18 DIAGNOSIS — Z860100 Personal history of colon polyps, unspecified: Secondary | ICD-10-CM

## 2024-07-18 DIAGNOSIS — Z8371 Family history of adenomatous and serrated polyps: Secondary | ICD-10-CM

## 2024-07-18 MED ORDER — NA SULFATE-K SULFATE-MG SULF 17.5-3.13-1.6 GM/177ML PO SOLN: 1.0000 | Freq: Once | ORAL | 0 refills | Status: AC

## 2024-07-18 NOTE — Progress Notes (Signed)
 Pt's name and DOB verified at the beginning of the pre-visit with 2 identifiers  Pt denies any difficulty with ambulating,sitting, laying down or rolling side to side  Pt has no issues moving head neck or swallowing  No egg or soy allergy known to patient   No issues known to pt with past sedation  No FH of Malignant Hyperthermia  Pt is not on home 02   Pt is not on blood thinners   Pt has frequent issues with constipation RN instructed pt to use Miralax per bottles instructions a week before prep days. Pt states they will  Pt is not on dialysis  Pt denise any abnormal heart rhythms   Pt denies any upcoming cardiac testing  Patient's chart reviewed by Norleen Schillings CNRA prior to pre-visit and patient appropriate for the [facility].  Pre-visit completed and red dot placed by patient's name on their procedure day (on provider's schedule).    Visit by phone  Pt states weight is 155 LB    Pt given  both [facility] main # and MD on call # prior to instructions.  Informed pt to come in at the time discussed and is shown on PV instructions.  Pt instructed to use Single[employer] or GoodRx for a price reduction on prep  Instructed pt where to find PV instructions in My Chart. . Instructed pt on all aspects of written instructions including med holds clothing to wear and foods to eat and not eat as well as after procedure legal restrictions and to call MD on call if needed.. Pt states understanding. Instructed pt to review instructions again prior to procedure and call main # given if has any questions or any issues. Pt states they will.

## 2024-07-19 ENCOUNTER — Encounter: Payer: Self-pay | Admitting: Internal Medicine

## 2024-08-01 ENCOUNTER — Encounter: Admitting: Internal Medicine

## 2024-08-02 ENCOUNTER — Encounter: Admitting: Internal Medicine
